# Patient Record
Sex: Male | Born: 1975 | Race: White | Hispanic: No | Marital: Married | Smoking: Never smoker
Health system: Southern US, Community
[De-identification: ages and names within clinical notes are randomized; demographics above are authoritative.]

## PROBLEM LIST (undated history)

## (undated) DIAGNOSIS — J45909 Unspecified asthma, uncomplicated: Secondary | ICD-10-CM

## (undated) HISTORY — DX: Unspecified asthma, uncomplicated: J45.909

---

## 2001-09-26 ENCOUNTER — Encounter: Payer: Self-pay | Admitting: Family Medicine

## 2001-09-26 ENCOUNTER — Ambulatory Visit (HOSPITAL_COMMUNITY): Admission: RE | Admit: 2001-09-26 | Discharge: 2001-09-26 | Payer: Self-pay | Admitting: Family Medicine

## 2008-01-30 HISTORY — PX: BACK SURGERY: SHX140

## 2008-12-17 ENCOUNTER — Emergency Department (HOSPITAL_COMMUNITY): Admission: EM | Admit: 2008-12-17 | Discharge: 2008-12-17 | Payer: Self-pay | Admitting: Emergency Medicine

## 2009-01-05 ENCOUNTER — Observation Stay (HOSPITAL_COMMUNITY): Admission: RE | Admit: 2009-01-05 | Discharge: 2009-01-06 | Payer: Self-pay | Admitting: Specialist

## 2009-01-05 ENCOUNTER — Encounter (INDEPENDENT_AMBULATORY_CARE_PROVIDER_SITE_OTHER): Payer: Self-pay | Admitting: Specialist

## 2010-05-02 LAB — CBC
MCHC: 33.7 g/dL (ref 30.0–36.0)
MCV: 90.4 fL (ref 78.0–100.0)
RDW: 12.7 % (ref 11.5–15.5)

## 2010-05-02 LAB — URINALYSIS, ROUTINE W REFLEX MICROSCOPIC
Bilirubin Urine: NEGATIVE
Hgb urine dipstick: NEGATIVE
Ketones, ur: NEGATIVE mg/dL
Nitrite: NEGATIVE
Urobilinogen, UA: 0.2 mg/dL (ref 0.0–1.0)

## 2010-05-02 LAB — COMPREHENSIVE METABOLIC PANEL
ALT: 48 U/L (ref 0–53)
Albumin: 4.5 g/dL (ref 3.5–5.2)
Alkaline Phosphatase: 56 U/L (ref 39–117)
BUN: 12 mg/dL (ref 6–23)
Calcium: 9.1 mg/dL (ref 8.4–10.5)
Potassium: 4.1 mEq/L (ref 3.5–5.1)
Sodium: 141 mEq/L (ref 135–145)
Total Protein: 7.2 g/dL (ref 6.0–8.3)

## 2010-05-02 LAB — APTT: aPTT: 30 seconds (ref 24–37)

## 2010-05-02 LAB — PROTIME-INR: Prothrombin Time: 13.9 seconds (ref 11.6–15.2)

## 2011-02-10 IMAGING — CR DG OR PORTABLE SPINE
1 series · 1 of 1 positions shown · non-contrast
Comparison: 01/05/2009 at 7711 hours

CLINICAL DATA: Disc protrusion at L4-5.

PORTABLE SPINE

[view not recorded]
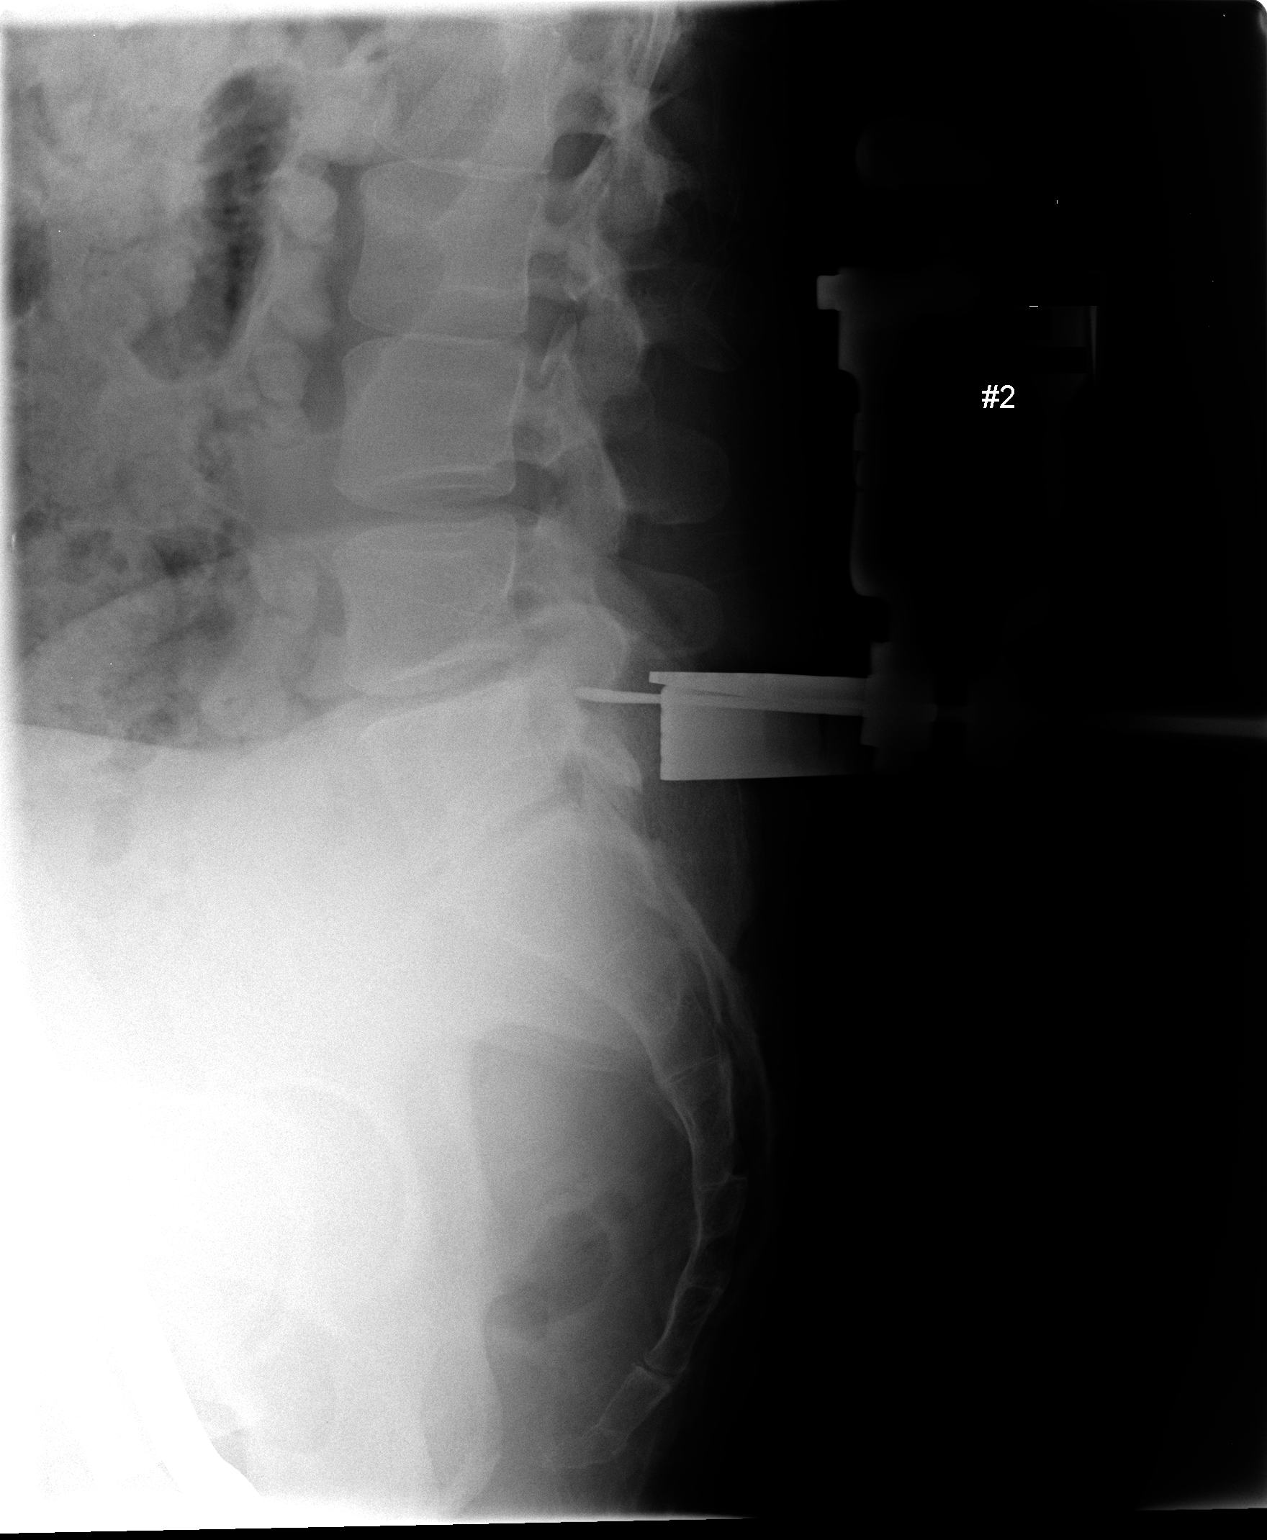

[1 of 1 positions shown; findings below may reference images not displayed]

FINDINGS: The blunt tip probe is oriented at the L5 S1 level, based
on the numeration used on the immediate prior exam.
IMPRESSION: 1.  Localization of the L5-S1 level.  The S1 vertebra is
transitional.

## 2018-10-31 ENCOUNTER — Other Ambulatory Visit: Payer: Self-pay | Admitting: *Deleted

## 2018-10-31 DIAGNOSIS — Z20822 Contact with and (suspected) exposure to covid-19: Secondary | ICD-10-CM

## 2018-11-02 LAB — NOVEL CORONAVIRUS, NAA: SARS-CoV-2, NAA: NOT DETECTED

## 2019-03-06 ENCOUNTER — Other Ambulatory Visit: Payer: Self-pay

## 2019-03-06 ENCOUNTER — Ambulatory Visit: Payer: Commercial Managed Care - PPO | Attending: Internal Medicine

## 2019-03-06 DIAGNOSIS — Z20822 Contact with and (suspected) exposure to covid-19: Secondary | ICD-10-CM | POA: Insufficient documentation

## 2019-03-08 LAB — NOVEL CORONAVIRUS, NAA: SARS-CoV-2, NAA: NOT DETECTED

## 2019-06-22 ENCOUNTER — Encounter: Payer: Self-pay | Admitting: Emergency Medicine

## 2019-06-22 ENCOUNTER — Other Ambulatory Visit: Payer: Self-pay

## 2019-06-22 ENCOUNTER — Ambulatory Visit
Admission: EM | Admit: 2019-06-22 | Discharge: 2019-06-22 | Disposition: A | Discharge status: Home / Self Care | Payer: Commercial Managed Care - PPO | Attending: Emergency Medicine | Admitting: Emergency Medicine

## 2019-06-22 DIAGNOSIS — L237 Allergic contact dermatitis due to plants, except food: Secondary | ICD-10-CM

## 2019-06-22 DIAGNOSIS — L299 Pruritus, unspecified: Secondary | ICD-10-CM

## 2019-06-22 DIAGNOSIS — R21 Rash and other nonspecific skin eruption: Secondary | ICD-10-CM

## 2019-06-22 MED ORDER — DEXAMETHASONE SODIUM PHOSPHATE 10 MG/ML IJ SOLN
10.0000 mg | Freq: Once | INTRAMUSCULAR | Status: AC
  Administered 2019-06-22: 10 mg via INTRAMUSCULAR

## 2019-06-22 MED ORDER — HYDROXYZINE HCL 25 MG PO TABS: 25.0000 mg | ORAL_TABLET | Freq: Four times a day (QID) | ORAL | 0 refills | Status: DC

## 2019-06-22 MED ORDER — PREDNISONE 20 MG PO TABS: 20.0000 mg | ORAL_TABLET | Freq: Two times a day (BID) | ORAL | 0 refills | Status: AC

## 2019-06-22 NOTE — Discharge Instructions (Signed)
Wash with warm water and mild soap Steroid shot given in office Prednisone prescribed.  Take as directed and to completion Use OTC zyrtec, allegra, or claritin during the day.  Benadryl or prescribed hydroxyzine at night. You may also use OTC hydrocortisone cream and/or calamine lotion to help alleviate itching Follow up with PCP if symptoms persists  Return or go to the ED if you have any new or worsening symptoms such as fever, chills, nausea, vomiting, difficulty breathing, throat swelling, tongue swelling, numbness/ tingling in mouth, worsening symptoms despite treatment, etc..Marland Kitchen

## 2019-06-22 NOTE — ED Triage Notes (Signed)
Poison oak rash that is itching and tingling to bilateral arms x2days.

## 2019-06-22 NOTE — ED Provider Notes (Signed)
[facility]   [id number] 06/22/19 Arrival Time: 9562  CC: Poison oak rash  SUBJECTIVE:  Ronald Gibson is a 44 y.o. male who presents with a poison oak rash to bilateral arms, ankles, and groin x 2 day.  Describes it as itchy.  Has tried OTC cream without relief.  Symptoms are made worse with scratching.  Reports similar symptoms in the past.   Denies fever, chills, nausea, vomiting, swelling, discharge, SOB, chest pain, abdominal pain, changes in bowel or bladder function.    ROS: As per HPI.  All other pertinent ROS negative.     History reviewed. No pertinent past medical history. Past Surgical History:  Procedure Laterality Date  . BACK SURGERY  2010   No Known Allergies No current facility-administered medications on file prior to encounter.   No current outpatient medications on file prior to encounter.   Social History   Socioeconomic History  . Marital status: Married    Spouse name: Not on file  . Number of children: Not on file  . Years of education: Not on file  . Highest education level: Not on file  Occupational History  . Not on file  Tobacco Use  . Smoking status: Not on file  Substance and Sexual Activity  . Alcohol use: Not on file  . Drug use: Not on file  . Sexual activity: Not on file  Other Topics Concern  . Not on file  Social History Narrative  . Not on file   Social Determinants of Health   Financial Resource Strain:   . Difficulty of Paying Living Expenses:   Food Insecurity:   . Worried About Charity fundraiser in the Last Year:   . Arboriculturist in the Last Year:   Transportation Needs:   . Film/video editor (Medical):   Marland Kitchen Lack of Transportation (Non-Medical):   Physical Activity:   . Days of Exercise per Week:   . Minutes of Exercise per Session:   Stress:   . Feeling of Stress :   Social Connections:   . Frequency of Communication with Friends and Family:   . Frequency of Social Gatherings with Friends and  Family:   . Attends Religious Services:   . Active Member of Clubs or Organizations:   . Attends Archivist Meetings:   Marland Kitchen Marital Status:   Intimate Partner Violence:   . Fear of Current or Ex-Partner:   . Emotionally Abused:   Marland Kitchen Physically Abused:   . Sexually Abused:    No family history on file.  OBJECTIVE: Vitals:   06/22/19 0955 06/22/19 0957  BP:  136/78  Pulse:  65  Resp:  17  Temp:  98.2 F (36.8 C)  TempSrc:  Oral  SpO2:  97%  Weight: 190 lb (86.2 kg)   Height: 6' (1.829 m)     General appearance: alert; no distress Head: NCAT Lungs: normal respiratory effor Extremities: no edema Skin: warm and dry; areas of linear papules and vesicles with surrounding erythema to bilateral upper extremities Psychological: alert and cooperative; normal mood and affect  ASSESSMENT & PLAN:  1. Poison oak dermatitis   2. Rash and nonspecific skin eruption   3. Itching     Meds ordered this encounter  Medications  . predniSONE (DELTASONE) 20 MG tablet    Sig: Take 1 tablet (20 mg total) by mouth 2 (two) times daily with a meal for 5 days.    Dispense:  10 tablet  Refill:  0    Order Specific Question:   Supervising Provider    Answer:   Eustace Moore [[id number]]  . hydrOXYzine (ATARAX/VISTARIL) 25 MG tablet    Sig: Take 1 tablet (25 mg total) by mouth every 6 (six) hours.    Dispense:  12 tablet    Refill:  0    Order Specific Question:   Supervising Provider    Answer:   Eustace Moore [[id number]]  . dexamethasone (DECADRON) injection 10 mg   Wash with warm water and mild soap Steroid shot given in office Prednisone prescribed.  Take as directed and to completion Use OTC zyrtec, allegra, or claritin during the day.  Benadryl or prescribed hydroxyzine at night. You may also use OTC hydrocortisone cream and/or calamine lotion to help alleviate itching Follow up with PCP if symptoms persists  Return or go to the ED if you have any new or worsening  symptoms such as fever, chills, nausea, vomiting, difficulty breathing, throat swelling, tongue swelling, numbness/ tingling in mouth, worsening symptoms despite treatment, etc...   Reviewed expectations re: course of current medical issues. Questions answered. Outlined signs and symptoms indicating need for more acute intervention. Patient verbalized understanding. After Visit Summary given.   Rennis Harding, PA-C 06/22/19 1009

## 2021-09-26 ENCOUNTER — Other Ambulatory Visit (HOSPITAL_COMMUNITY): Payer: Self-pay | Admitting: Family Medicine

## 2021-09-26 ENCOUNTER — Ambulatory Visit (HOSPITAL_COMMUNITY)
Admission: RE | Admit: 2021-09-26 | Discharge: 2021-09-26 | Disposition: A | Discharge status: Home / Self Care | Payer: Commercial Managed Care - PPO | Source: Ambulatory Visit | Attending: Family Medicine | Admitting: Family Medicine

## 2021-09-26 DIAGNOSIS — J45909 Unspecified asthma, uncomplicated: Secondary | ICD-10-CM | POA: Diagnosis present

## 2022-01-29 ENCOUNTER — Ambulatory Visit
Admission: EM | Admit: 2022-01-29 | Discharge: 2022-01-29 | Disposition: A | Discharge status: Home / Self Care | Payer: Commercial Managed Care - PPO | Attending: Nurse Practitioner | Admitting: Nurse Practitioner

## 2022-01-29 ENCOUNTER — Encounter: Payer: Self-pay | Admitting: Emergency Medicine

## 2022-01-29 DIAGNOSIS — R1310 Dysphagia, unspecified: Secondary | ICD-10-CM

## 2022-01-29 DIAGNOSIS — J029 Acute pharyngitis, unspecified: Secondary | ICD-10-CM | POA: Diagnosis not present

## 2022-01-29 MED ORDER — AMOXICILLIN 500 MG PO CAPS: 500.0000 mg | ORAL_CAPSULE | Freq: Two times a day (BID) | ORAL | 0 refills | Status: AC

## 2022-01-29 NOTE — ED Provider Notes (Signed)
[facility]    CSN: [id number] Arrival date & time: 01/29/22  1432      History   Chief Complaint Chief Complaint  Patient presents with   Sore Throat   Nasal Congestion    HPI Ronald Gibson is a 47 y.o. male.   Patient presents today for a few day history of sensation that something is lodged inside the right side of his throat.  He denies recent fever, shortness of breath or chest pain, sore throat, swollen glands, headache, ear pain or pressure, abdominal pain, nausea/vomiting, diarrhea, decreased appetite, or fatigue.  Reports he has been dealing with cough and congestion that has come and gone for the past year.  He is getting over cough and congestion that he has been with for the past week or so.  No postnasal drainage, runny nose or nasal congestion.  Reports he has a sensation that he feels like he needs to vomit, or nothing comes up.  The sensation that something is lodged inside of his throat gets better when he attempts to vomit.  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  No history of smoking or vaping.  Reports history of asthma, currently taking Singulair and Breztri Asthma inhaler daily.  Has not needed to use rescue inhaler per his report.     History reviewed. No pertinent past medical history.  There are no problems to display for this patient.   Past Surgical History:  Procedure Laterality Date   BACK SURGERY  2010       Home Medications    Prior to Admission medications   Medication Sig Start Date End Date Taking? Authorizing Provider  amoxicillin (AMOXIL) 500 MG capsule Take 1 capsule (500 mg total) by mouth 2 (two) times daily for 10 days. 01/29/22 02/08/22 Yes Eulogio Bear, NP  hydrOXYzine (ATARAX/VISTARIL) 25 MG tablet Take 1 tablet (25 mg total) by mouth every 6 (six) hours. 06/22/19   Lestine Box, PA-C    Family History History reviewed. No pertinent family history.  Social History      Allergies   Patient has no known allergies.   Review of Systems Review of Systems Per HPI  Physical Exam Triage Vital Signs ED Triage Vitals  Enc Vitals Group     BP 01/29/22 1631 (!) 153/95     Pulse Rate 01/29/22 1631 65     Resp 01/29/22 1631 17     Temp 01/29/22 1631 98 F (36.7 C)     Temp Source 01/29/22 1631 Oral     SpO2 01/29/22 1631 96 %     Weight --      Height --      Head Circumference --      Peak Flow --      Pain Score 01/29/22 1630 4     Pain Loc --      Pain Edu? --      Excl. in [place]? --    No data found.  Updated Vital Signs BP (!) 153/95 (BP Location: Right Arm)   Pulse 65   Temp 98 F (36.7 C) (Oral)   Resp 17   SpO2 96%   Visual Acuity Right Eye Distance:   Left Eye Distance:   Bilateral Distance:    Right Eye Near:   Left Eye Near:    Bilateral Near:     Physical Exam Vitals and nursing note reviewed.  Constitutional:      General: He  is not in acute distress.    Appearance: He is well-developed. He is not toxic-appearing.  HENT:     Head: Normocephalic and atraumatic.     Right Ear: Tympanic membrane and ear canal normal. No drainage, swelling or tenderness. No middle ear effusion. Tympanic membrane is not erythematous.     Left Ear: Tympanic membrane and ear canal normal. No drainage, swelling or tenderness.  No middle ear effusion. Tympanic membrane is not erythematous.     Nose: No congestion or rhinorrhea.     Mouth/Throat:     Mouth: Mucous membranes are moist.     Pharynx: Oropharynx is clear. No oropharyngeal exudate, posterior oropharyngeal erythema or uvula swelling.     Tonsils: No tonsillar exudate. 0 on the right. 0 on the left.     Comments: Cobblestoning of posterior pharynx Neck:     Thyroid: No thyromegaly.  Cardiovascular:     Rate and Rhythm: Normal rate and regular rhythm.  Pulmonary:     Effort: Pulmonary effort is normal. No respiratory distress.     Breath sounds: Normal breath sounds. No wheezing,  rhonchi or rales.  Abdominal:     General: Bowel sounds are normal. There is no distension.     Palpations: Abdomen is soft.     Tenderness: There is no abdominal tenderness. There is no rebound.  Musculoskeletal:     Cervical back: Normal range of motion and neck supple.  Lymphadenopathy:     Cervical: No cervical adenopathy.  Skin:    General: Skin is warm and dry.     Capillary Refill: Capillary refill takes less than 2 seconds.     Coloration: Skin is not pale.     Findings: No erythema or rash.  Neurological:     Mental Status: He is alert and oriented to person, place, and time.  Psychiatric:        Behavior: Behavior is cooperative.      UC Treatments / Results  Labs (all labs ordered are listed, but only abnormal results are displayed) Labs Reviewed - No data to display  EKG   Radiology No results found.  Procedures Procedures (including critical care time)  Medications Ordered in UC Medications - No data to display  Initial Impression / Assessment and Plan / UC Course  I have reviewed the triage vital signs and the nursing notes.  Pertinent labs & imaging results that were available during my care of the patient were reviewed by me and considered in my medical decision making (see chart for details).   Patient is well-appearing, normotensive, afebrile, not tachycardic, not tachypneic, oxygenating well on room air.    Swallowing problem Acute pharyngitis, unspecified etiology Differentials include bacterial pharyngitis, GERD Will cover for bacterial pharyngitis given on and off congestion for the past year with amoxicillin twice daily for 10 days Recommended over-the-counter omeprazole with improvement in symptoms Recommended close follow-up with primary care provider if no improvement with these medications Could also consider follow-up with ENT for further evaluation of esophagus Strict ER precautions discussed  The patient was given the opportunity to  ask questions.  All questions answered to their satisfaction.  The patient is in agreement to this plan.     Final Clinical Impressions(s) / UC Diagnoses   Final diagnoses:  Swallowing problem  History of asthma  Acute pharyngitis, unspecified etiology     Discharge Instructions      I am unsure what is causing the pressure in your throat today.  I think it could be a bacterial infection in your throat or possibly acid reflux.  Start the amoxicillin twice daily for 10 days to treat bacterial infection.  With no improvement, you can begin the omeprazole 20 mg OTC which would help with acid build up in your stomach.  This would take a couple of weeks to take effect.  Follow up with PCP with no improvement or worsening of symptoms despite these treatments for further evaluation.     ED Prescriptions     Medication Sig Dispense Auth. Provider   amoxicillin (AMOXIL) 500 MG capsule Take 1 capsule (500 mg total) by mouth 2 (two) times daily for 10 days. 20 capsule Valentino Nose, NP      PDMP not reviewed this encounter.   Valentino Nose, NP 01/29/22 1723

## 2022-01-29 NOTE — ED Triage Notes (Signed)
Pt reports feeling like something is lodged in his throat on the right side and it feels like its pushing up against it. Feels like vomiting will the relieve the pressure but nothing comes up.

## 2022-01-29 NOTE — Discharge Instructions (Addendum)
I am unsure what is causing the pressure in your throat today.  I think it could be a bacterial infection in your throat or possibly acid reflux.  Start the amoxicillin twice daily for 10 days to treat bacterial infection.  With no improvement, you can begin the omeprazole 20 mg OTC which would help with acid build up in your stomach.  This would take a couple of weeks to take effect.  Follow up with PCP with no improvement or worsening of symptoms despite these treatments for further evaluation.

## 2022-02-03 ENCOUNTER — Ambulatory Visit (INDEPENDENT_AMBULATORY_CARE_PROVIDER_SITE_OTHER): Payer: Commercial Managed Care - PPO

## 2022-02-03 ENCOUNTER — Ambulatory Visit
Admission: EM | Admit: 2022-02-03 | Discharge: 2022-02-03 | Disposition: A | Discharge status: Home / Self Care | Payer: Commercial Managed Care - PPO

## 2022-02-03 DIAGNOSIS — R0602 Shortness of breath: Secondary | ICD-10-CM

## 2022-02-03 DIAGNOSIS — J45901 Unspecified asthma with (acute) exacerbation: Secondary | ICD-10-CM

## 2022-02-03 DIAGNOSIS — R062 Wheezing: Secondary | ICD-10-CM | POA: Diagnosis not present

## 2022-02-03 MED ORDER — ALBUTEROL SULFATE (2.5 MG/3ML) 0.083% IN NEBU
2.5000 mg | INHALATION_SOLUTION | Freq: Once | RESPIRATORY_TRACT | Status: AC
  Administered 2022-02-03: 2.5 mg via RESPIRATORY_TRACT

## 2022-02-03 MED ORDER — PREDNISONE 50 MG PO TABS: ORAL_TABLET | ORAL | 0 refills | Status: DC

## 2022-02-03 MED ORDER — METHYLPREDNISOLONE SODIUM SUCC 125 MG IJ SOLR
80.0000 mg | Freq: Once | INTRAMUSCULAR | Status: AC
  Administered 2022-02-03: 80 mg via INTRAMUSCULAR

## 2022-02-03 NOTE — ED Provider Notes (Signed)
[facility]    CSN: [id number] Arrival date & time: 02/03/22  0804      History   Chief Complaint Chief Complaint  Patient presents with   Wheezing    HPI Ronald Gibson is a 47 y.o. male.   The history is provided by the patient.   Patient presents for complaints of wheezing and shortness of breath that has been occurring over the last several days.  Patient reports that over the last year, he has had intermittent nasal congestion.  Patient was seen in this clinic on 01/29/22 and prescribed amoxicillin.  He is currently still taking this medication.  He denies fever, chills, sore throat, abdominal pain, nausea, vomiting, diarrhea, or chest pain.  Patient reports that he does use Symbicort and Singulair regularly.  He also reports that he has an albuterol inhaler.  Patient reports a history of childhood asthma.  Reports that his asthma has been controlled since childhood up until the last year.  History reviewed. No pertinent past medical history.  There are no problems to display for this patient.   Past Surgical History:  Procedure Laterality Date   BACK SURGERY  2010       Home Medications    Prior to Admission medications   Medication Sig Start Date End Date Taking? Authorizing Provider  albuterol (VENTOLIN HFA) 108 (90 Base) MCG/ACT inhaler SMARTSIG:1 Puff(s) By Mouth Every 4 Hours PRN 11/05/21  Yes [provider]  montelukast (SINGULAIR) 10 MG tablet Take 10 mg by mouth daily. 01/21/22  Yes [provider]  predniSONE (DELTASONE) 50 MG tablet Take one tablet daily with breakfast for the next 5 days. 02/03/22  Yes Javanni Maring-Warren, Alda Lea, NP  SYMBICORT 160-4.5 MCG/ACT inhaler SMARTSIG:2 Puff(s) By Mouth Twice Daily 09/27/21  Yes [provider]  amoxicillin (AMOXIL) 500 MG capsule Take 1 capsule (500 mg total) by mouth 2 (two) times daily for 10 days. 01/29/22 02/08/22  Eulogio Bear, NP  hydrOXYzine (ATARAX/VISTARIL) 25 MG tablet  Take 1 tablet (25 mg total) by mouth every 6 (six) hours. 06/22/19   Lestine Box, PA-C    Family History History reviewed. No pertinent family history.  Social History Social History   Tobacco Use   Smoking status: Never   Smokeless tobacco: Never  Substance Use Topics   Alcohol use: Never   Drug use: Never     Allergies   Patient has no known allergies.   Review of Systems Review of Systems Per HPI  Physical Exam Triage Vital Signs ED Triage Vitals  Enc Vitals Group     BP 02/03/22 0816 (!) 148/95     Pulse Rate 02/03/22 0816 91     Resp 02/03/22 0816 18     Temp 02/03/22 0816 98.4 F (36.9 C)     Temp Source 02/03/22 0816 Oral     SpO2 02/03/22 0816 94 %     Weight --      Height --      Head Circumference --      Peak Flow --      Pain Score 02/03/22 0819 0     Pain Loc --      Pain Edu? --      Excl. in [place]? --    No data found.  Updated Vital Signs BP (!) 148/95 (BP Location: Right Arm)   Pulse 91   Temp 98.4 F (36.9 C) (Oral)   Resp 18   SpO2 97%   Visual  Acuity Right Eye Distance:   Left Eye Distance:   Bilateral Distance:    Right Eye Near:   Left Eye Near:    Bilateral Near:     Physical Exam Vitals and nursing note reviewed.  Constitutional:      General: He is not in acute distress.    Appearance: Normal appearance. He is well-developed.  HENT:     Head: Normocephalic and atraumatic.     Right Ear: Tympanic membrane, ear canal and external ear normal.     Left Ear: Tympanic membrane, ear canal and external ear normal.     Nose: Congestion present. No rhinorrhea.     Mouth/Throat:     Mouth: Mucous membranes are moist.  Eyes:     Extraocular Movements: Extraocular movements intact.     Conjunctiva/sclera: Conjunctivae normal.     Pupils: Pupils are equal, round, and reactive to light.  Neck:     Thyroid: No thyromegaly.     Trachea: No tracheal deviation.  Cardiovascular:     Rate and Rhythm: Normal rate and regular  rhythm.     Pulses: Normal pulses.     Heart sounds: Normal heart sounds.  Pulmonary:     Effort: Pulmonary effort is normal.     Breath sounds: Decreased air movement present. Examination of the right-upper field reveals wheezing. Examination of the left-upper field reveals wheezing. Examination of the right-lower field reveals wheezing. Examination of the left-lower field reveals wheezing. Wheezing present.     Comments: Patient is speaking in complete sentences, and is in no acute distress.  Nebulizer treatment administered.  Post nebulizer treatment, patient with improvement of air movement, wheezing noted, but markedly decreased throughout. Abdominal:     General: Bowel sounds are normal. There is no distension.     Palpations: Abdomen is soft.     Tenderness: There is no abdominal tenderness.  Musculoskeletal:     Cervical back: Normal range of motion and neck supple.  Skin:    General: Skin is warm and dry.  Neurological:     General: No focal deficit present.     Mental Status: He is alert and oriented to person, place, and time.  Psychiatric:        Mood and Affect: Mood normal.        Behavior: Behavior normal.        Thought Content: Thought content normal.        Judgment: Judgment normal.      UC Treatments / Results  Labs (all labs ordered are listed, but only abnormal results are displayed) Labs Reviewed - No data to display  EKG   Radiology DG Chest 2 View  Result Date: 02/03/2022 CLINICAL DATA:  Shortness of breath and wheezing. EXAM: CHEST - 2 VIEW COMPARISON:  September 26, 2021 FINDINGS: The heart size and mediastinal contours are within normal limits. Both lungs are clear. The visualized skeletal structures are unremarkable. IMPRESSION: No active cardiopulmonary disease. Electronically Signed   By: Gerome Sam III M.D.   On: 02/03/2022 08:38    Procedures Procedures (including critical care time)  Medications Ordered in UC Medications  albuterol  (PROVENTIL) (2.5 MG/3ML) 0.083% nebulizer solution 2.5 mg (2.5 mg Nebulization Given 02/03/22 0836)  methylPREDNISolone sodium succinate (SOLU-MEDROL) 125 mg/2 mL injection 80 mg (80 mg Intramuscular Given 02/03/22 0836)    Initial Impression / Assessment and Plan / UC Course  I have reviewed the triage vital signs and the nursing notes.  Pertinent labs &  imaging results that were available during my care of the patient were reviewed by me and considered in my medical decision making (see chart for details).  The patient is well-appearing, he is in no acute distress, vital signs are stable.  Symptoms consistent with an acute asthma exacerbation.  Chest x-ray did not show any active cardiopulmonary disease.  Solu-Medrol 80 mg IM and albuterol nebulizer was administered.  Patient reports good improvement of his breathing and wheezing.  Will start patient on prednisone for 50 mg for the next 5 days.  Patient is currently still taking his amoxicillin, continue medication at this time.  Supportive care recommendations were provided to the patient to include use of a humidifier at bedtime during sleep, and sleeping elevated as needed.  Strict ER precautions were provided to the patient.  Patient verbalizes understanding.  All questions were answered.  Patient stable for discharge.   Final Clinical Impressions(s) / UC Diagnoses   Final diagnoses:  Asthma with acute exacerbation, unspecified asthma severity, unspecified whether persistent     Discharge Instructions      The chest x-ray is negative for pneumonia or other abnormal findings. Take medication as prescribed.  Continue use of the Symbicort, albuterol inhaler, and montelukast that you are currently taking. Increase fluids and allow for plenty of rest. Try to become aware of things that may trigger your allergies. Recommend use of a humidifier in your bedroom at nighttime during sleep to help with cough.  May also be helpful to sleep elevated  on pillows as needed.   Go to the emergency department if you experience worsening shortness of breath, wheezing, or other concerns. Recommend following up with your primary care physician within the next 7 to 10 days for reevaluation. Follow-up as needed.      ED Prescriptions     Medication Sig Dispense Auth. Provider   predniSONE (DELTASONE) 50 MG tablet Take one tablet daily with breakfast for the next 5 days. 5 tablet Liat Mayol-Warren, Sadie Haber, NP      PDMP not reviewed this encounter.   Abran Cantor, NP 02/03/22 [phone]

## 2022-02-03 NOTE — ED Triage Notes (Signed)
Pt reports on and off nasal congestion, chest congestion and wheezing. Reports he has asthma and this year was bad.

## 2022-02-03 NOTE — Discharge Instructions (Addendum)
The chest x-ray is negative for pneumonia or other abnormal findings. Take medication as prescribed.  Continue use of the Symbicort, albuterol inhaler, and montelukast that you are currently taking. Increase fluids and allow for plenty of rest. Try to become aware of things that may trigger your allergies. Recommend use of a humidifier in your bedroom at nighttime during sleep to help with cough.  May also be helpful to sleep elevated on pillows as needed.   Go to the emergency department if you experience worsening shortness of breath, wheezing, or other concerns. Recommend following up with your primary care physician within the next 7 to 10 days for reevaluation. Follow-up as needed.

## 2022-02-26 ENCOUNTER — Ambulatory Visit (INDEPENDENT_AMBULATORY_CARE_PROVIDER_SITE_OTHER): Payer: Commercial Managed Care - PPO | Admitting: Internal Medicine

## 2022-02-26 ENCOUNTER — Encounter: Payer: Self-pay | Admitting: Internal Medicine

## 2022-02-26 ENCOUNTER — Other Ambulatory Visit: Payer: Self-pay

## 2022-02-26 DIAGNOSIS — R053 Chronic cough: Secondary | ICD-10-CM | POA: Diagnosis not present

## 2022-02-26 DIAGNOSIS — R0602 Shortness of breath: Secondary | ICD-10-CM

## 2022-02-26 DIAGNOSIS — R062 Wheezing: Secondary | ICD-10-CM

## 2022-02-26 DIAGNOSIS — J310 Chronic rhinitis: Secondary | ICD-10-CM | POA: Diagnosis not present

## 2022-02-26 DIAGNOSIS — J4540 Moderate persistent asthma, uncomplicated: Secondary | ICD-10-CM | POA: Diagnosis not present

## 2022-02-26 DIAGNOSIS — K219 Gastro-esophageal reflux disease without esophagitis: Secondary | ICD-10-CM

## 2022-02-26 DIAGNOSIS — R09A2 Foreign body sensation, throat: Secondary | ICD-10-CM

## 2022-02-26 MED ORDER — FLUTICASONE PROPIONATE 50 MCG/ACT NA SUSP: 2.0000 | Freq: Every day | NASAL | 5 refills | Status: DC

## 2022-02-26 MED ORDER — BREZTRI AEROSPHERE 160-9-4.8 MCG/ACT IN AERO: 2.0000 | INHALATION_SPRAY | Freq: Two times a day (BID) | RESPIRATORY_TRACT | 5 refills | Status: DC

## 2022-02-26 MED ORDER — FAMOTIDINE 20 MG PO TABS: 20.0000 mg | ORAL_TABLET | Freq: Every day | ORAL | 5 refills | Status: DC | PRN

## 2022-02-26 MED ORDER — ALBUTEROL SULFATE HFA 108 (90 BASE) MCG/ACT IN AERS: 2.0000 | INHALATION_SPRAY | Freq: Four times a day (QID) | RESPIRATORY_TRACT | 1 refills | Status: DC | PRN

## 2022-02-26 MED ORDER — MONTELUKAST SODIUM 10 MG PO TABS: 10.0000 mg | ORAL_TABLET | Freq: Every day | ORAL | 5 refills | Status: DC

## 2022-02-26 NOTE — Addendum Note (Signed)
Addended by: Jimmy Footman on: 02/26/2022 02:06 PM   Modules accepted: Orders

## 2022-02-26 NOTE — Patient Instructions (Addendum)
Chronic Rhinitis: - Use nasal saline rinses before nose sprays such as with Neilmed Sinus Rinse.  Use distilled water.   - Use Flonase 2 sprays each nostril daily. Aim upward and outward. - Hold all anti-histamines 3 days prior to next visit for aeroallergen testing 1-59.    Moderate Persistent Asthma: - MDI technique discussed.  Spirometry normal today. Spacer given.  - Maintenance inhaler:  Continue Breztri 2 puffs twice daily with spacer. Brush and gargle after.  Continue Singulair 10mg  daily.  - Rescue inhaler: Albuterol 2 puffs via spacer or 1 vial via nebulizer every 4-6 hours as needed for respiratory symptoms of cough, shortness of breath, or wheezing Asthma control goals:  Full participation in all desired activities (may need albuterol before activity) Albuterol use two times or less a week on average (not counting use with activity) Cough interfering with sleep two times or less a month Oral steroids no more than once a year No hospitalizations  GERD/Globus Sensation -Can consider ENT referral if persistent for globus sensation.  -Use Pepcid 20mg  daily as needed.   -Avoid lying down for at least two hours after a meal or after drinking acidic beverages, like soda, or other caffeinated beverages. This can help to prevent stomach contents from flowing back into the esophagus. -Keep your head elevated while you sleep. Using an extra pillow or two can also help to prevent reflux. -Eat smaller and more frequent meals each day instead of a few large meals. This promotes digestion and can aid in preventing heartburn. -Wear loose-fitting clothes to ease pressure on the stomach, which can worsen heartburn and reflux. -Reduce excess weight around the midsection. This can ease pressure on the stomach. Such pressure can force some stomach contents back up the esophagus.

## 2022-02-26 NOTE — Progress Notes (Signed)
NEW PATIENT  Date of Service/Encounter:  02/26/22  Consult requested by: [facility]   Subjective:   Ronald Gibson (DOB: 1975-05-17) is a 47 y.o. male who presents to the clinic on 02/26/2022 with a chief complaint of Asthma (Asthma was pretty bad when he was younger. His allergies have flared up his asthma. ) and Allergic Rhinitis  (Had allergy shots when he was young. Allergies have been going on worse for the past year and has flared his asthma up since then. ) .    History obtained from: chart review and patient.   Asthma:  Diagnosed at age 33 He does have trouble with asthma flare ups related to allergy flare ups.  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 His asthma has worsened for the past 1.5 year and so has his allergies.  He did have COVID about 2 years ago and reports it was mild.  He had a flare up recently at the beginning of this month requiring albuterol nebulizer and prednisone.  Only with flare ups daytime symptoms in past month, no nighttime awakenings in past month Using rescue inhaler: last use was 2 weeks ago with flare up Limitations to daily activity: mild 1 ED visits,/UC visits and 4 oral steroids in the past year 0 number of lifetime hospitalizations, 0 number of lifetime intubations.  Identified Triggers:  allergies and respiratory illness Prior PFTs or spirometry: none Previously on Symbicort   Current regimen:  Maintenance: Singulair daily and Breztri 2 puffs twice daily; he does gargle afterwards but does not use a spacer Rescue: Albuterol 2 puffs q4-6 hrs PRN  Rhinitis:  Started since infancy.  Symptoms include: nasal congestion, rhinorrhea, post nasal drainage, and sneezing  He did AIT when he was young for a couple of years but can't recall why he stopped.  Occurs year-round Potential triggers: not  sure Treatments tried:  Flonase daily  Previous allergy testing: yes when he was a child but can't recall the results History of reflux/heartburn: not sure; he feels like something is lodged in his throat and he has tried Tums without relief. Denies any heartburn.   History of sinus surgery: no Nonallergic triggers: none   Past Medical History: Past Medical History:  Diagnosis Date   Asthma      Past Surgical History: Past Surgical History:  Procedure Laterality Date   BACK SURGERY  2010    Family History: Family History  Problem Relation Age of Onset   Emphysema Father    Asthma Brother     Social History:  Tobacco use/exposure: none Job: Quarry manager  Medication List:  Allergies as of 02/26/2022   No Known Allergies      Medication List        Accurate as of February 26, 2022 12:24 PM. If you have any questions, ask your nurse or doctor.          AirDuo Digihaler 55-14 MCG/ACT Aepb Generic drug: Fluticasone-Salmeterol(sensor) SMARTSIG:1 Puff(s) By Mouth Every 12 Hours   albuterol 108 (90 Base) MCG/ACT inhaler Commonly known as: VENTOLIN HFA SMARTSIG:1 Puff(s) By Mouth Every 4 Hours PRN   Breztri Aerosphere 160-9-4.8 MCG/ACT Aero Generic drug: Budeson-Glycopyrrol-Formoterol Inhale 2 puffs into the lungs 2 (two) times daily.   FLONASE NA Place into the nose.   hydrOXYzine 25 MG tablet Commonly known as: ATARAX Take 1 tablet (25 mg total)  by mouth every 6 (six) hours.   montelukast 10 MG tablet Commonly known as: SINGULAIR Take 10 mg by mouth daily.   predniSONE 50 MG tablet Commonly known as: DELTASONE Take one tablet daily with breakfast for the next 5 days.   Symbicort 160-4.5 MCG/ACT inhaler Generic drug: budesonide-formoterol SMARTSIG:2 Puff(s) By Mouth Twice Daily         REVIEW OF SYSTEMS: Pertinent positives and negatives discussed in HPI.   Objective:   Physical Exam: BP 130/86   Pulse (!) 105   Temp 98 F (36.7 C)    Resp 18   Ht 5\' 10"  (1.778 m)   Wt 203 lb 2 oz (92.1 kg)   SpO2 97%   BMI 29.15 kg/m  Body mass index is 29.15 kg/m. GEN: alert, well developed HEENT: clear conjunctiva, TM grey and translucent, nose with + inferior turbinate hypertrophy, pink nasal mucosa, slight clear rhinorrhea, + cobblestoning HEART: regular rate and rhythm, no murmur LUNGS: clear to auscultation bilaterally, no coughing, unlabored respiration ABDOMEN: soft, non distended  SKIN: no rashes or lesions  Reviewed:  02/03/2022: seen in ER for wheezing, SOB for the past several days.  Also has congestion and was given amoxicillin.  On symbicort and singulair daily. Wheezing and decreased air movement noted on exam. Given Solumedrol and albuterol with improvement. Discharged home on prednisone.   01/29/2022: seen in ER for sensation of something lodged in R side of throat.  Also with cough/congestion. On singulair and Breztri.  Treated for possible pharyngitis with Amoxicillin.  Also discussed GERD.    01/14/2017: seen in [facility] for acute pansinusitis, started on Augmentin, Flonase, nasal saline, Guaifenesin-DM.   Spirometry:  Tracings reviewed. His effort: Good reproducible efforts. FVC: 4.45L FEV1: 3.74L, 91% predicted FEV1/FVC ratio: 84% Interpretation: Spirometry consistent with normal pattern.  Please see scanned spirometry results for details.   Assessment:   1. Moderate persistent asthma without complication   2. Chronic rhinitis   3. Gastroesophageal reflux disease, unspecified whether esophagitis present   4. Globus sensation     Plan/Recommendations:  Chronic Rhinitis: - Use nasal saline rinses before nose sprays such as with Neilmed Sinus Rinse.  Use distilled water.   - Use Flonase 2 sprays each nostril daily. Aim upward and outward. - Hold all anti-histamines 3 days prior to next visit for aeroallergen testing 1-59.    Moderate Persistent Asthma: - Uncontrolled with recent exacerbation  and seems he was escalated from Symbicort to [place].  Will see how he does on triple inhaler.  If he remains uncontrolled, next step would be a biologic.  - MDI technique discussed.  Spirometry normal today on Breztri. Spacer given.  - Maintenance inhaler:  Continue Breztri 158mcg 2 puffs twice daily with spacer. Brush and gargle after.  Continue Singulair 10mg  daily.  - Rescue inhaler: Albuterol 2 puffs via spacer or 1 vial via nebulizer every 4-6 hours as needed for respiratory symptoms of cough, shortness of breath, or wheezing Asthma control goals:  Full participation in all desired activities (may need albuterol before activity) Albuterol use two times or less a week on average (not counting use with activity) Cough interfering with sleep two times or less a month Oral steroids no more than once a year No hospitalizations  GERD/Globus Sensation -Can consider ENT referral if persistent for globus sensation.  -Use Pepcid 20mg  daily as needed.   -Avoid lying down for at least two hours after a meal or after drinking acidic beverages, like soda,  or other caffeinated beverages. This can help to prevent stomach contents from flowing back into the esophagus. -Keep your head elevated while you sleep. Using an extra pillow or two can also help to prevent reflux. -Eat smaller and more frequent meals each day instead of a few large meals. This promotes digestion and can aid in preventing heartburn. -Wear loose-fitting clothes to ease pressure on the stomach, which can worsen heartburn and reflux. -Reduce excess weight around the midsection. This can ease pressure on the stomach. Such pressure can force some stomach contents back up the esophagus.      Return in about 1 week (around 03/05/2022).  Alesia Morin, MD [facility]

## 2022-03-05 ENCOUNTER — Ambulatory Visit (INDEPENDENT_AMBULATORY_CARE_PROVIDER_SITE_OTHER): Payer: Commercial Managed Care - PPO | Admitting: Internal Medicine

## 2022-03-05 ENCOUNTER — Encounter: Payer: Self-pay | Admitting: Internal Medicine

## 2022-03-05 VITALS — BP 132/84 | HR 82 | Temp 99.1°F | Resp 20

## 2022-03-05 DIAGNOSIS — J3089 Other allergic rhinitis: Secondary | ICD-10-CM

## 2022-03-05 DIAGNOSIS — J302 Other seasonal allergic rhinitis: Secondary | ICD-10-CM

## 2022-03-05 MED ORDER — CETIRIZINE HCL 10 MG PO TABS: 10.0000 mg | ORAL_TABLET | Freq: Every day | ORAL | 5 refills | Status: DC

## 2022-03-05 MED ORDER — AZELASTINE HCL 0.1 % NA SOLN: 2.0000 | Freq: Two times a day (BID) | NASAL | 5 refills | Status: DC | PRN

## 2022-03-05 MED ORDER — OLOPATADINE HCL 0.1 % OP SOLN: 1.0000 [drp] | OPHTHALMIC | 5 refills | Status: DC | PRN

## 2022-03-05 NOTE — Progress Notes (Signed)
FOLLOW UP Date of Service/Encounter:  03/05/22   Subjective:  Ronald Gibson (DOB: 12/01/1975) is a 47 y.o. male who returns to the [facility] on 03/05/2022 for follow up for skin testing.   History obtained from: chart review and patient. Reports asthma is doing well. No albuterol use last week and only once this morning due to mild SOB.  No wheezing.  Still having a lot of congestion, runny nose, drainage.    Past Medical History: Past Medical History:  Diagnosis Date   Asthma     Objective:  BP 132/84   Pulse 82   Temp 99.1 F (37.3 C)   Resp 20   SpO2 97%  There is no height or weight on file to calculate BMI. Physical Exam: GEN: alert, well developed HEENT: clear conjunctiva, MMM HEART: regular rate LUNGS: no coughing, unlabored respiration SKIN: no rashes or lesions  Skin Testing:  Skin prick testing was placed, which includes aeroallergens/foods, histamine control, and saline control.  Verbal consent was obtained prior to placing test.  Patient tolerated procedure well.  Allergy testing results were read and interpreted by myself, documented by clinical staff. Adequate positive and negative control.  Positive results to:  Results discussed with patient/family.  Airborne Adult Perc - 03/05/22 1016     Time Antigen Placed 1016    Allergen Manufacturer Lavella Hammock    Location Back    Number of Test 59    1. Control-Buffer 50% Glycerol Negative    2. Control-Histamine 1 mg/ml 3+    3. Albumin saline Negative    4. Blair 3+    5. [place] 3+    6. Johnson 3+    7. [place] Blue 3+    8. Meadow Fescue 3+    9. Perennial Rye 3+    10. Sweet Vernal 3+    11. Timothy 3+    12. Cocklebur Negative    13. Burweed Marshelder Negative    14. Ragweed, short Negative    15. Ragweed, Giant Negative    16. Plantain,  English Negative    17. Lamb's Quarters 2+    18. Sheep Sorrell 2+    19. Rough Pigweed 2+    20. Marsh Elder, Rough Negative    21.  Mugwort, Common 2+    22. Ash mix Negative    23. Birch mix Negative    24. Beech American 2+    25. Box, Elder Negative    26. Cedar, red Negative    27. Cottonwood, [place] Negative    28. Elm mix Negative    29. Hickory 3+    30. Maple mix 3+    31. Oak, [place] mix Negative    32. Pecan Pollen 3+    33. Pine mix Negative    34. Sycamore Eastern Negative    35. [place], Black Pollen 3+    36. Alternaria alternata Negative    37. Cladosporium Herbarum Negative    38. Aspergillus mix Negative    39. Penicillium mix Negative    40. Bipolaris sorokiniana (Helminthosporium) Negative    41. Drechslera spicifera (Curvularia) Negative    42. Mucor plumbeus 2+    43. Fusarium moniliforme 2+    44. Aureobasidium pullulans (pullulara) Negative    45. Rhizopus oryzae Negative    46. Botrytis cinera 2+    47. Epicoccum nigrum 2+    48. Phoma betae Negative    49. Candida Albicans Negative    50. Trichophyton  mentagrophytes Negative    51. Mite, D Farinae  5,000 AU/ml 3+    52. Mite, D Pteronyssinus  5,000 AU/ml 3+    53. Cat Hair 10,000 BAU/ml 2+    54.  Dog Epithelia 2+    55. Mixed Feathers Negative    56. Horse Epithelia 2+    57. Cockroach, German 2+    58. Mouse Negative    59. Tobacco Leaf Negative              Assessment:   1. Seasonal and perennial allergic rhinitis     Plan/Recommendations:  Allergic Rhinitis: - Positive skin test 03/2022: trees, grasses, weeds, mold, DM, cat, dog, horse, cockroach.  - Avoidance measures discussed. - Use nasal saline rinses before nose sprays such as with Neilmed Sinus Rinse.  Use distilled water.   - Use Flonase 2 sprays each nostril daily. Aim upward and outward. - Use Azelastine 2 sprays each nostril twice daily as needed. Aim upward and outward. - Use Zyrtec 10 mg daily.  - Use Singulair 10mg  daily.  Stop if there are any mood/behavioral changes. - For eyes, use Olopatadine or Ketotifen 1 eye drop daily as needed for  itchy, watery eyes.  Available over the counter, if not covered by insurance.  - Consider allergy shots as long term control of your symptoms by teaching your immune system to be more tolerant of your allergy triggers. Given information for insurance.   Moderate Persistent Asthma: - MDI technique discussed.  - Maintenance inhaler:  Continue Breztri 2 puffs twice daily with spacer. Brush and gargle after.  Continue Singulair 10mg  daily.  - Rescue inhaler: Albuterol 2 puffs via spacer or 1 vial via nebulizer every 4-6 hours as needed for respiratory symptoms of cough, shortness of breath, or wheezing Asthma control goals:  Full participation in all desired activities (may need albuterol before activity) Albuterol use two times or less a week on average (not counting use with activity) Cough interfering with sleep two times or less a month Oral steroids no more than once a year No hospitalizations  GERD/Globus Sensation -Can consider ENT referral if persistent for globus sensation.  -Use Pepcid 20mg  daily as needed.   -Avoid lying down for at least two hours after a meal or after drinking acidic beverages, like soda, or other caffeinated beverages. This can help to prevent stomach contents from flowing back into the esophagus. -Keep your head elevated while you sleep. Using an extra pillow or two can also help to prevent reflux. -Eat smaller and more frequent meals each day instead of a few large meals. This promotes digestion and can aid in preventing heartburn. -Wear loose-fitting clothes to ease pressure on the stomach, which can worsen heartburn and reflux. -Reduce excess weight around the midsection. This can ease pressure on the stomach. Such pressure can force some stomach contents back up the esophagus.    Return in about 4 weeks (around 04/02/2022).  Harlon Flor, MD [facility]

## 2022-03-05 NOTE — Patient Instructions (Addendum)
Allergic Rhinitis: - Positive skin test 03/2022: trees, grasses, weeds, mold, DM, cat, dog, horse, cockroach.  - Avoidance measures discussed. - Use nasal saline rinses before nose sprays such as with Neilmed Sinus Rinse.  Use distilled water.   - Use Flonase 2 sprays each nostril daily. Aim upward and outward. - Use Azelastine 2 sprays each nostril twice daily as needed. Aim upward and outward. - Use Zyrtec 10 mg daily.  - Use Singulair 10mg  daily.  Stop if there are any mood/behavioral changes. - For eyes, use Olopatadine or Ketotifen 1 eye drop daily as needed for itchy, watery eyes.  Available over the counter, if not covered by insurance.  - Consider allergy shots as long term control of your symptoms by teaching your immune system to be more tolerant of your allergy triggers  Moderate Persistent Asthma: - MDI technique discussed.  - Maintenance inhaler:  Continue Breztri 2 puffs twice daily with spacer. Brush and gargle after.  Continue Singulair 10mg  daily.  - Rescue inhaler: Albuterol 2 puffs via spacer or 1 vial via nebulizer every 4-6 hours as needed for respiratory symptoms of cough, shortness of breath, or wheezing Asthma control goals:  Full participation in all desired activities (may need albuterol before activity) Albuterol use two times or less a week on average (not counting use with activity) Cough interfering with sleep two times or less a month Oral steroids no more than once a year No hospitalizations  GERD/Globus Sensation -Can consider ENT referral if persistent for globus sensation.  -Use Pepcid 20mg  daily as needed.   -Avoid lying down for at least two hours after a meal or after drinking acidic beverages, like soda, or other caffeinated beverages. This can help to prevent stomach contents from flowing back into the esophagus. -Keep your head elevated while you sleep. Using an extra pillow or two can also help to prevent reflux. -Eat smaller and more frequent  meals each day instead of a few large meals. This promotes digestion and can aid in preventing heartburn. -Wear loose-fitting clothes to ease pressure on the stomach, which can worsen heartburn and reflux. -Reduce excess weight around the midsection. This can ease pressure on the stomach. Such pressure can force some stomach contents back up the esophagus.    ALLERGEN AVOIDANCE MEASURES   Dust Mites Use central air conditioning and heat; and change the filter monthly.  Pleated filters work better than mesh filters.  Electrostatic filters may also be used; wash the filter monthly.  Window air conditioners may be used, but do not clean the air as well as a central air conditioner.  Change or wash the filter monthly. Keep windows closed.  Do not use attic fans.   Encase the mattress, box springs and pillows with zippered, dust proof covers. Wash the bed linens in hot water weekly.   Remove carpet, especially from the bedroom. Remove stuffed animals, throw pillows, dust ruffles, heavy drapes and other items that collect dust from the bedroom. Do not use a humidifier.   Use wood, vinyl or leather furniture instead of cloth furniture in the bedroom. Keep the indoor humidity at 30 - 40%.  Monitor with a humidity gauge.  Molds - Indoor avoidance Use air conditioning to reduce indoor humidity.  Do not use a humidifier. Keep indoor humidity at 30 - 40%.  Use a dehumidifier if needed. In the bathroom use an exhaust fan or open a window after showering.  Wipe down damp surfaces after showering.  Clean bathrooms with  a mold-killing solution (diluted bleach, or products like Tilex, etc) at least once a month. In the kitchen use an exhaust fan to remove steam from cooking.  Throw away spoiled foods immediately, and empty garbage daily.  Empty water pans below self-defrosting refrigerators frequently. Vent the clothes dryer to the outside. Limit indoor houseplants; mold grows in the dirt.  No houseplants  in the bedroom. Remove carpet from the bedroom. Encase the mattress and box springs with a zippered encasing.  Molds - Outdoor avoidance Avoid being outside when the grass is being mowed, or the ground is tilled. Avoid playing in leaves, pine straw, hay, etc.  Dead plant materials contain mold. Avoid going into barns or grain storage areas. Remove leaves, clippings and compost from around the home.  Cockroach Limit spread of food around the house; especially keep food out of bedrooms. Keep food and garbage in closed containers with a tight lid.  Never leave food out in the kitchen.  Do not leave out pet food or dirty food bowls. Mop the kitchen floor and wash countertops at least once a week. Repair leaky pipes and faucets so there is no standing water to attract roaches. Plug up cracks in the house through which cockroaches can enter. Use bait stations and approved pesticides to reduce cockroach infestation. Pollen Avoidance Pollen levels are highest during the mid-day and afternoon.  Consider this when planning outdoor activities. Avoid being outside when the grass is being mowed, or wear a mask if the pollen-allergic person must be the one to mow the grass. Keep the windows closed to keep pollen outside of the home. Use an air conditioner to filter the air. Take a shower, wash hair, and change clothing after working or playing outdoors during pollen season. Pet Dander Keep the pet out of your bedroom and restrict it to only a few rooms. Be advised that keeping the pet in only one room will not limit the allergens to that room. Don't pet, hug or kiss the pet; if you do, wash your hands with soap and water. High-efficiency particulate air (HEPA) cleaners run continuously in a bedroom or living room can reduce allergen levels over time. Regular use of a high-efficiency vacuum cleaner or a central vacuum can reduce allergen levels. Giving your pet a bath at least once a week can reduce  airborne allergen.

## 2022-04-09 ENCOUNTER — Ambulatory Visit (INDEPENDENT_AMBULATORY_CARE_PROVIDER_SITE_OTHER): Payer: Commercial Managed Care - PPO | Admitting: Internal Medicine

## 2022-04-09 ENCOUNTER — Other Ambulatory Visit: Payer: Self-pay

## 2022-04-09 ENCOUNTER — Encounter: Payer: Self-pay | Admitting: Internal Medicine

## 2022-04-09 VITALS — BP 132/70 | HR 80 | Temp 98.0°F | Resp 20 | Ht 71.0 in | Wt 212.0 lb

## 2022-04-09 DIAGNOSIS — R09A2 Foreign body sensation, throat: Secondary | ICD-10-CM | POA: Diagnosis not present

## 2022-04-09 DIAGNOSIS — J302 Other seasonal allergic rhinitis: Secondary | ICD-10-CM

## 2022-04-09 DIAGNOSIS — K219 Gastro-esophageal reflux disease without esophagitis: Secondary | ICD-10-CM | POA: Diagnosis not present

## 2022-04-09 DIAGNOSIS — J3089 Other allergic rhinitis: Secondary | ICD-10-CM | POA: Diagnosis not present

## 2022-04-09 DIAGNOSIS — J4540 Moderate persistent asthma, uncomplicated: Secondary | ICD-10-CM

## 2022-04-09 MED ORDER — OLOPATADINE HCL 0.1 % OP SOLN: 1.0000 [drp] | OPHTHALMIC | 5 refills | Status: DC | PRN

## 2022-04-09 MED ORDER — CETIRIZINE HCL 10 MG PO TABS: 10.0000 mg | ORAL_TABLET | Freq: Every day | ORAL | 5 refills | Status: DC

## 2022-04-09 MED ORDER — FLUTICASONE PROPIONATE 50 MCG/ACT NA SUSP: 2.0000 | Freq: Every day | NASAL | 5 refills | Status: DC

## 2022-04-09 MED ORDER — AZELASTINE HCL 0.1 % NA SOLN: 2.0000 | Freq: Two times a day (BID) | NASAL | 5 refills | Status: DC | PRN

## 2022-04-09 MED ORDER — MONTELUKAST SODIUM 10 MG PO TABS: 10.0000 mg | ORAL_TABLET | Freq: Every day | ORAL | 5 refills | Status: DC

## 2022-04-09 MED ORDER — EPINEPHRINE 0.3 MG/0.3ML IJ SOAJ: 0.3000 mg | INTRAMUSCULAR | 1 refills | Status: DC | PRN

## 2022-04-09 MED ORDER — FAMOTIDINE 20 MG PO TABS: 20.0000 mg | ORAL_TABLET | Freq: Every day | ORAL | 5 refills | Status: DC | PRN

## 2022-04-09 MED ORDER — ALBUTEROL SULFATE HFA 108 (90 BASE) MCG/ACT IN AERS: 2.0000 | INHALATION_SPRAY | Freq: Four times a day (QID) | RESPIRATORY_TRACT | 1 refills | Status: DC | PRN

## 2022-04-09 MED ORDER — BREZTRI AEROSPHERE 160-9-4.8 MCG/ACT IN AERO: 2.0000 | INHALATION_SPRAY | Freq: Two times a day (BID) | RESPIRATORY_TRACT | 5 refills | Status: DC

## 2022-04-09 NOTE — Patient Instructions (Addendum)
Allergic Rhinitis: - Positive skin test 03/2022: trees, grasses, weeds, mold, DM, cat, dog, horse, cockroach.  - Avoidance measures discussed. - Use nasal saline rinses before nose sprays such as with Neilmed Sinus Rinse.  Use distilled water.   - Use Flonase 2 sprays each nostril daily. Aim upward and outward. - Use Azelastine 2 sprays each nostril twice daily as needed. Aim upward and outward. - Use Zyrtec 10 mg daily.  - Use Singulair '10mg'$  daily.  Stop if there are any mood/behavioral changes. - For eyes, use Olopatadine or Ketotifen 1 eye drop daily as needed for itchy, watery eyes.  Available over the counter, if not covered by insurance.  - We will place order for the allergy shots today.  Bring Epipen with you for each shot visit.  Do your best to stay on the shot schedule of once a week.    Moderate Persistent Asthma: - MDI technique discussed.  - Maintenance inhaler:  Continue Breztri 2 puffs twice daily with spacer. Brush and gargle after.  Continue Singulair '10mg'$  daily.  - Rescue inhaler: Albuterol 2 puffs via spacer or 1 vial via nebulizer every 4-6 hours as needed for respiratory symptoms of cough, shortness of breath, or wheezing Asthma control goals:  Full participation in all desired activities (may need albuterol before activity) Albuterol use two times or less a week on average (not counting use with activity) Cough interfering with sleep two times or less a month Oral steroids no more than once a year No hospitalizations  GERD/Globus Sensation -Can consider ENT referral if persistent for globus sensation.  -Use Pepcid '20mg'$  daily as needed or if symptoms worsen, do a 4 week course of Prilosec '20mg'$  daily.  -Avoid lying down for at least two hours after a meal or after drinking acidic beverages, like soda, or other caffeinated beverages. This can help to prevent stomach contents from flowing back into the esophagus. -Keep your head elevated while you sleep. Using an extra  pillow or two can also help to prevent reflux. -Eat smaller and more frequent meals each day instead of a few large meals. This promotes digestion and can aid in preventing heartburn. -Wear loose-fitting clothes to ease pressure on the stomach, which can worsen heartburn and reflux. -Reduce excess weight around the midsection. This can ease pressure on the stomach. Such pressure can force some stomach contents back up the esophagus.

## 2022-04-09 NOTE — Progress Notes (Signed)
FOLLOW UP Date of Service/Encounter:  04/09/22   Subjective:  Ronald Gibson (DOB: December 11, 1975) is a 47 y.o. male who returns to the [facility] on 04/09/2022 for follow up for allergic rhinoconjunctivitis, moderate persistent asthma, GERD/globus sensation.   History obtained from: chart review and patient. Last visit was with me on March 05, 2022 for skin testing.  He has severe allergic rhinitis and was positive to trees, grasses, weeds, mold, dust mite, cat, dog, horse, cockroach.  We started him on a combination of medical therapy with INCS, INAH, OAH and Singulair.  Rhinoconjunctivitis: Since last visit, he reports doing a little better and has noticed some difference in his stuffy nose.  But still continues to have frequent episodes of runny nose, congestion, drainage.  Still blows out a lot of mucus also.  Using Flonase daily and azelastine twice a day.  Also taking Zyrtec and Singulair regularly.  Uses Olopatadine eyedrops PRN but his nasal symptoms are most concerning for him.   Asthma: Reports doing okay overall.  He did have 1 episode since our initial visit in January where he had some shortness of breath and wheezing requiring albuterol daily for about 3 days.  He is not sure what caused this 1 but does not recall being ill.  He did not have to go to the urgent care or ER.  No oral prednisone.  He is taking Breztri 2 puffs twice daily.  GERD: Reports doing a lot better.  He tried Pepcid that we had discussed last visit but it did not help so he ended up doing a 14-day course of Prilosec which did help.  He denies having much issues now with the globus sensation, reflux or heartburn.    Past Medical History: Past Medical History:  Diagnosis Date   Asthma     Objective:  BP 132/70   Pulse 80   Temp 98 F (36.7 C)   Resp 20   Ht '5\' 11"'$  (1.803 m)   Wt 212 lb (96.2 kg)   SpO2 98%   BMI 29.57 kg/m  Body mass index is 29.57 kg/m. Physical Exam: GEN:  alert, well developed HEENT: clear conjunctiva, nose with moderate inferior turbinate hypertrophy, pink nasal mucosa, clear rhinorrhea, + cobblestoning HEART: regular rate and rhythm, no murmur LUNGS: clear to auscultation bilaterally, no coughing, unlabored respiration SKIN: no rashes or lesions   Spirometry:  Tracings reviewed. His effort: Good reproducible efforts. FVC: 3.81 L FEV1: 3.29 L, 80% predicted FEV1/FVC ratio: 86% Interpretation: Spirometry consistent with possible restrictive disease.  Please see scanned spirometry results for details.  Assessment:   1. Moderate persistent asthma without complication   2. Seasonal and perennial allergic rhinitis   3. Gastroesophageal reflux disease, unspecified whether esophagitis present   4. Globus sensation     Plan/Recommendations:  Allergic Rhinitis: - Remains uncontrolled despite maximal medical therapy. He discussed the CPT codes for AIT with insurance company and wishes to start. He has signed the consent forms.  - Positive skin test 03/2022: trees, grasses, weeds, mold, DM, cat, dog, horse, cockroach.  - Avoidance measures discussed. - Use nasal saline rinses before nose sprays such as with Neilmed Sinus Rinse.  Use distilled water.   - Use Flonase 2 sprays each nostril daily. Aim upward and outward. - Use Azelastine 2 sprays each nostril twice daily as needed. Aim upward and outward. - Use Zyrtec 10 mg daily.  - Use Singulair '10mg'$  daily.  Stop if there are any mood/behavioral  changes. - For eyes, use Olopatadine or Ketotifen 1 eye drop daily as needed for itchy, watery eyes.  Available over the counter, if not covered by insurance.  - We will place order for the allergy shots today.  Bring Epipen with you for each shot visit.  Do your best to stay on the shot schedule of once a week.    Moderate Persistent Asthma: - MDI technique discussed. Improved from prior, will continue Breztri.  Spirometry without obstruction today.   - Maintenance inhaler:  Continue Breztri 2 puffs twice daily with spacer. Brush and gargle after.  Continue Singulair '10mg'$  daily.  - Rescue inhaler: Albuterol 2 puffs via spacer or 1 vial via nebulizer every 4-6 hours as needed for respiratory symptoms of cough, shortness of breath, or wheezing Asthma control goals:  Full participation in all desired activities (may need albuterol before activity) Albuterol use two times or less a week on average (not counting use with activity) Cough interfering with sleep two times or less a month Oral steroids no more than once a year No hospitalizations  GERD/Globus Sensation -Improved with a course of Prilosec.  - Can consider ENT referral if persistent for globus sensation.  -Use Pepcid '20mg'$  daily as needed or if symptoms worsen, do a 4 week course of Prilosec '20mg'$  daily.  -Avoid lying down for at least two hours after a meal or after drinking acidic beverages, like soda, or other caffeinated beverages. This can help to prevent stomach contents from flowing back into the esophagus. -Keep your head elevated while you sleep. Using an extra pillow or two can also help to prevent reflux. -Eat smaller and more frequent meals each day instead of a few large meals. This promotes digestion and can aid in preventing heartburn. -Wear loose-fitting clothes to ease pressure on the stomach, which can worsen heartburn and reflux. -Reduce excess weight around the midsection. This can ease pressure on the stomach. Such pressure can force some stomach contents back up the esophagus.       Return in about 3 months (around 07/10/2022).  Harlon Flor, MD [facility]

## 2022-04-10 DIAGNOSIS — J3089 Other allergic rhinitis: Secondary | ICD-10-CM | POA: Diagnosis not present

## 2022-04-10 NOTE — Progress Notes (Signed)
VIALS EXP 04-10-23

## 2022-04-10 NOTE — Progress Notes (Signed)
Aeroallergen Immunotherapy   Ordering Provider: Harlon Flor   Patient Details  Name: Ronald Gibson  MRN: [id number]  Date of Birth: 09/19/1975   Order 1 of 2   Vial Label: pollen, DM, dog, cat, CR   0.3 ml (Volume)  BAU Concentration -- 7 Grass Mix* 100,000 ([address] [place], [place], [place], Perennial R[place], RedTop, Sweet Vernal, Timothy)  0.2 ml (Volume)  1:20 Concentration -- [place]  0.3 ml (Volume)  BAU Concentration -- [place] 10,000  0.2 ml (Volume)  1:20 Concentration -- [place]  0.5 ml (Volume)  1:20 Concentration -- Weed Mix*  0.5 ml (Volume)  1:20 Concentration -- Eastern 10 Tree Mix (also Sweet Gum)  0.2 ml (Volume)  1:10 Concentration -- Pecan Pollen  0.2 ml (Volume)  1:20 Concentration -- Walnut, Black Pollen  0.5 ml (Volume)  1:10 Concentration -- Cat Hair  0.3 ml (Volume)  1:20 Concentration -- Cockroach, German  0.5 ml (Volume)  1:10 Concentration -- Dog Epithelia  0.5 ml (Volume)   AU Concentration -- Mite Mix (DF 5,000 & DP 5,000)     4.2  ml Extract Subtotal  0.8  ml Diluent  5.0  ml Maintenance Total   Schedule:  B  Silver Vial (1:1,000,000): Schedule B (6 doses)  Blue Vial (1:100,000): Schedule B (6 doses)  Yellow Vial (1:10,000): Schedule B (6 doses)  Green Vial (1:1,000): Schedule B (6 doses)  Red Vial (1:100): Schedule A (10 doses)   Special Instructions: Bring Epipen

## 2022-04-10 NOTE — Progress Notes (Signed)
Aeroallergen Immunotherapy  Ordering Provider: Harlon Flor  Patient Details Name: JAMORRIS ORTON MRN: [id number] Date of Birth: 09-28-1975  Order 2 of 2  Vial Label: mold  0.2 ml (Volume)  1:10 Concentration -- Mucor plumbeus 0.2 ml (Volume)  1:10 Concentration -- Fusarium moniliforme 0.2 ml (Volume)  1:40 Concentration -- Botrytis cinerea 0.2 ml (Volume)  1:40 Concentration -- Epicoccum nigrum   0.8  ml Extract Subtotal 4.2  ml Diluent 5.0  ml Maintenance Total  Schedule:  B Silver Vial (1:1,000,000): Schedule B (6 doses) Blue Vial (1:100,000): Schedule B (6 doses) Yellow Vial (1:10,000): Schedule B (6 doses) Green Vial (1:1,000): Schedule B (6 doses) Red Vial (1:100): Schedule A (10 doses)  Special Instructions: Bring Epipen

## 2022-04-11 DIAGNOSIS — J302 Other seasonal allergic rhinitis: Secondary | ICD-10-CM | POA: Diagnosis not present

## 2022-05-02 ENCOUNTER — Ambulatory Visit (INDEPENDENT_AMBULATORY_CARE_PROVIDER_SITE_OTHER): Payer: Commercial Managed Care - PPO

## 2022-05-02 DIAGNOSIS — J309 Allergic rhinitis, unspecified: Secondary | ICD-10-CM

## 2022-05-02 NOTE — Progress Notes (Signed)
Immunotherapy   Patient Details  Name: JAKE IRIGOYEN MRN: [id number] Date of Birth: 01-16-1976  05/02/2022  Jamey Ripa started injections for  molds, pollens, cats, dogs, dust mites, and cockroaches. Following schedule: B  Frequency:1 time per week Epi-Pen:Epi-Pen Available  Consent signed previously and patient instructions given. Patient waited in the lobby for thirty minutes without an issue.   Julius Bowels 05/02/2022, 10:27 AM

## 2022-05-11 ENCOUNTER — Encounter: Payer: Self-pay | Admitting: Allergy & Immunology

## 2022-05-11 ENCOUNTER — Ambulatory Visit (INDEPENDENT_AMBULATORY_CARE_PROVIDER_SITE_OTHER): Payer: Commercial Managed Care - PPO | Admitting: Allergy & Immunology

## 2022-05-11 ENCOUNTER — Other Ambulatory Visit: Payer: Self-pay

## 2022-05-11 VITALS — BP 132/72 | HR 80 | Temp 98.3°F | Resp 20 | Ht 71.0 in | Wt 205.0 lb

## 2022-05-11 DIAGNOSIS — J4540 Moderate persistent asthma, uncomplicated: Secondary | ICD-10-CM | POA: Diagnosis not present

## 2022-05-11 MED ORDER — GUAIFENESIN-CODEINE 100-10 MG/5ML PO SOLN: 10.0000 mL | Freq: Three times a day (TID) | ORAL | 0 refills | Status: DC | PRN

## 2022-05-11 MED ORDER — PREDNISONE 10 MG PO TABS: ORAL_TABLET | ORAL | 0 refills | Status: DC

## 2022-05-11 NOTE — Patient Instructions (Signed)
Cough - Start a prednisone burst. - Start the cough medicine every 8 hours as needed. - Call us if you think you need an antibiotic. - Lung testing looked SUPERB. - Come back next week to restart the shots.   2. Follow up as scheduled.    Please inform us of any Emergency Department visits, hospitalizations, or changes in symptoms. Call us before going to the ED for breathing or allergy symptoms since we might be able to fit you in for a sick visit. Feel free to contact us anytime with any questions, problems, or concerns.  It was a pleasure to meet you today!  Websites that have reliable patient information: 1. American Academy of Asthma, Allergy, and Immunology: www.aaaai.org 2. Food Allergy Research and Education (FARE): foodallergy.org 3. Mothers of Asthmatics: http://www.asthmacommunitynetwork.org 4. American College of Allergy, Asthma, and Immunology: www.acaai.org   COVID-19 Vaccine Information can be found at: [email or web address] For questions related to vaccine distribution or appointments, please email [email or web address] or call [phone].   We realize that you might be concerned about having an allergic reaction to the COVID19 vaccines. To help with that concern, WE ARE OFFERING THE COVID19 VACCINES IN OUR OFFICE! Ask the front desk for dates!     "Like" [place] on Facebook and Instagram for our latest updates!      A healthy democracy works best when [employer] participate! Make sure you are registered to vote! If you have moved or changed any of your contact information, you will need to get this updated before voting!  In some cases, you MAY be able to register to vote online: [email or web address]

## 2022-05-11 NOTE — Progress Notes (Signed)
FOLLOW UP  Date of Service/Encounter:  05/11/22   Assessment:   Moderate persistent asthma, uncomplicated  Seasonal and perennial allergic rhinitis (trees, grasses, weeds, mold, dust mite, cat, dog, horse, and cockroach) - recently started allergen immunotherapy   GERD - with globus sensation  Plan/Recommendations:   Cough - likely allergic flare (spiro is normal)  - Start a prednisone burst. - Start the cough medicine every 8 hours as needed. - Call us if you think you need an antibiotic. - Lung testing looked SUPERB. - Come back next week to restart the shots.   2. Follow up as scheduled.    Subjective:   Ronald Gibson is a 47 y.o. male presenting today for follow up of  Chief Complaint  Patient presents with   Asthma    Pt states last Monday and Tuesday he started having some issues with whezzing and coughing he states he used his neb it helped some but not went a way.    Ronald Gibson has a history of the following: There are no problems to display for this patient.   History obtained from: chart review and patient.  Ronald Gibson is a 47 y.o. male presenting for a follow up visit.  He was last seen in March 2024.  At that time, he continued to have uncontrolled allergic rhinitis and decided to start allergen immunotherapy.  He had skin testing in February that was positive to trees, grasses, weeds, mold, dust mite, cat, dog, horse, and cockroach.  He was continued on Flonase as well as Astelin, Zyrtec, and Singulair.  For his asthma, his spirometry looked better.  He was continued on Breztri 2 puffs twice daily as well as the Singulair.  For his GERD, he was continued on Pepcid 20 mg daily as well as Prilosec 20 mg daily.  Since the last visit, he has continue to have allergy symptoms. He was in [place] over the weekend for a baseball tournament. His son plays for [employer]. He was fine when he was down there and was doing well. Then Wednesday of last week,  he got his first allergy shot and started having the situation with a rebound set of symptoms.   He remains on all of his medicaitons. He has not tried doubling up on on the antihistamine. The coughing is the worst part of everything.  He has some Lawyer.  Asthma/Respiratory Symptom History: He has been using his nebulizer at bay. He feels that heh as been breathing decently, but he report sthat this is thin productive cough.   He came to Dr. Allena Katz on [address]. PCP had already increase it to [place] from [facility].  He has never been on a biologic.  I do not see any complete blood count in the system. His breathing never really got bad until the past couple of years.   Allergic Rhinitis Symptom History: He did receive the 1 injection.  He was on allergy shots when he was much younger, but he did not stick with them.    Otherwise, there have been no changes to his past medical history, surgical history, family history, or social history.    Review of Systems  Constitutional: Negative.  Negative for fever, malaise/fatigue and weight loss.  HENT:  Positive for congestion. Negative for ear discharge and ear pain.        Positive for postnasal drip.  Eyes:  Negative for pain, discharge and redness.  Respiratory:  Positive for cough and sputum production.  Negative for shortness of breath and wheezing.   Cardiovascular: Negative.  Negative for chest pain and palpitations.  Gastrointestinal:  Negative for abdominal pain, heartburn, nausea and vomiting.  Skin: Negative.  Negative for itching and rash.  Neurological:  Negative for dizziness and headaches.  Endo/Heme/Allergies:  Positive for environmental allergies. Does not bruise/bleed easily.       Objective:   Blood pressure 132/72, pulse 80, temperature 98.3 F (36.8 C), resp. rate 20, height  (1.803 m), weight 205 lb (93 kg), SpO2 95 %. Body mass index is 28.59 kg/m.    Physical Exam Vitals reviewed.  Constitutional:       Appearance: He is well-developed.  HENT:     Head: Normocephalic and atraumatic.     Right Ear: Tympanic membrane, ear canal and external ear normal.     Left Ear: Tympanic membrane, ear canal and external ear normal.     Nose: Rhinorrhea present. No nasal deformity, septal deviation or mucosal edema.     Right Turbinates: Enlarged, swollen and pale.     Left Turbinates: Enlarged, swollen and pale.     Right Sinus: No maxillary sinus tenderness or frontal sinus tenderness.     Left Sinus: No maxillary sinus tenderness or frontal sinus tenderness.     Comments: No nasal polyps.    Mouth/Throat:     Mouth: Mucous membranes are not pale and not dry.     Pharynx: Uvula midline.  Eyes:     General: Lids are normal. No allergic shiner.       Right eye: No discharge.        Left eye: No discharge.     Conjunctiva/sclera: Conjunctivae normal.     Right eye: Right conjunctiva is not injected. No chemosis.    Left eye: Left conjunctiva is not injected. No chemosis.    Pupils: Pupils are equal, round, and reactive to light.  Cardiovascular:     Rate and Rhythm: Normal rate and regular rhythm.     Heart sounds: Normal heart sounds.  Pulmonary:     Effort: Pulmonary effort is normal. No tachypnea, accessory muscle usage or respiratory distress.     Breath sounds: Normal breath sounds. No wheezing, rhonchi or rales.  Chest:     Chest wall: No tenderness.  Lymphadenopathy:     Cervical: No cervical adenopathy.  Skin:    Coloration: Skin is not pale.     Findings: No abrasion, erythema, petechiae or rash. Rash is not papular, urticarial or vesicular.  Neurological:     Mental Status: He is alert.  Psychiatric:        Behavior: Behavior is cooperative.      Diagnostic studies:    Spirometry: results normal (FEV1: 3.24/77%, FVC: 4.00/75%, FEV1/FVC: 81%).    Spirometry consistent with normal pattern. This is stable compared to his last spirometry.   Allergy Studies: none        Malachi Bonds, MD  [facility]

## 2022-05-16 ENCOUNTER — Ambulatory Visit (INDEPENDENT_AMBULATORY_CARE_PROVIDER_SITE_OTHER): Payer: Commercial Managed Care - PPO

## 2022-05-16 DIAGNOSIS — J309 Allergic rhinitis, unspecified: Secondary | ICD-10-CM

## 2022-05-25 ENCOUNTER — Ambulatory Visit (INDEPENDENT_AMBULATORY_CARE_PROVIDER_SITE_OTHER): Payer: Commercial Managed Care - PPO

## 2022-05-25 DIAGNOSIS — J309 Allergic rhinitis, unspecified: Secondary | ICD-10-CM

## 2022-06-01 ENCOUNTER — Ambulatory Visit (INDEPENDENT_AMBULATORY_CARE_PROVIDER_SITE_OTHER): Payer: Commercial Managed Care - PPO

## 2022-06-01 DIAGNOSIS — J309 Allergic rhinitis, unspecified: Secondary | ICD-10-CM

## 2022-06-08 ENCOUNTER — Ambulatory Visit (INDEPENDENT_AMBULATORY_CARE_PROVIDER_SITE_OTHER): Payer: Commercial Managed Care - PPO

## 2022-06-08 DIAGNOSIS — J309 Allergic rhinitis, unspecified: Secondary | ICD-10-CM | POA: Diagnosis not present

## 2022-06-13 ENCOUNTER — Ambulatory Visit (INDEPENDENT_AMBULATORY_CARE_PROVIDER_SITE_OTHER): Payer: Commercial Managed Care - PPO

## 2022-06-13 DIAGNOSIS — J309 Allergic rhinitis, unspecified: Secondary | ICD-10-CM | POA: Diagnosis not present

## 2022-06-20 ENCOUNTER — Ambulatory Visit (INDEPENDENT_AMBULATORY_CARE_PROVIDER_SITE_OTHER): Payer: Commercial Managed Care - PPO

## 2022-06-20 DIAGNOSIS — J309 Allergic rhinitis, unspecified: Secondary | ICD-10-CM | POA: Diagnosis not present

## 2022-07-02 ENCOUNTER — Ambulatory Visit (INDEPENDENT_AMBULATORY_CARE_PROVIDER_SITE_OTHER): Payer: Commercial Managed Care - PPO | Admitting: Internal Medicine

## 2022-07-02 ENCOUNTER — Other Ambulatory Visit: Payer: Self-pay

## 2022-07-02 VITALS — BP 118/88 | HR 100 | Temp 98.6°F | Resp 18 | Ht 70.0 in | Wt 200.6 lb

## 2022-07-02 DIAGNOSIS — J309 Allergic rhinitis, unspecified: Secondary | ICD-10-CM

## 2022-07-02 DIAGNOSIS — J4540 Moderate persistent asthma, uncomplicated: Secondary | ICD-10-CM | POA: Diagnosis not present

## 2022-07-02 DIAGNOSIS — J3089 Other allergic rhinitis: Secondary | ICD-10-CM

## 2022-07-02 DIAGNOSIS — K219 Gastro-esophageal reflux disease without esophagitis: Secondary | ICD-10-CM

## 2022-07-02 MED ORDER — FLUTICASONE PROPIONATE 50 MCG/ACT NA SUSP: 2.0000 | Freq: Every day | NASAL | 5 refills | Status: DC

## 2022-07-02 MED ORDER — AZELASTINE HCL 0.1 % NA SOLN: 2.0000 | Freq: Two times a day (BID) | NASAL | 5 refills | Status: DC | PRN

## 2022-07-02 MED ORDER — MONTELUKAST SODIUM 10 MG PO TABS: 10.0000 mg | ORAL_TABLET | Freq: Every day | ORAL | 5 refills | Status: DC

## 2022-07-02 MED ORDER — ALBUTEROL SULFATE HFA 108 (90 BASE) MCG/ACT IN AERS: 2.0000 | INHALATION_SPRAY | Freq: Four times a day (QID) | RESPIRATORY_TRACT | 1 refills | Status: DC | PRN

## 2022-07-02 MED ORDER — BREZTRI AEROSPHERE 160-9-4.8 MCG/ACT IN AERO: 2.0000 | INHALATION_SPRAY | Freq: Two times a day (BID) | RESPIRATORY_TRACT | 5 refills | Status: DC

## 2022-07-02 MED ORDER — CETIRIZINE HCL 10 MG PO TABS: 10.0000 mg | ORAL_TABLET | Freq: Every day | ORAL | 5 refills | Status: DC

## 2022-07-02 NOTE — Addendum Note (Signed)
Addended by: Elsworth Soho on: 07/02/2022 05:02 PM   Modules accepted: Orders

## 2022-07-02 NOTE — Progress Notes (Signed)
FOLLOW UP Date of Service/Encounter:  07/02/22   Subjective:  Ronald Gibson (DOB: Jun 25, 1975) is a 47 y.o. male who [facility] for follow up for  allergic rhinoconjunctivitis, moderate persistent asthma, GERD.  History obtained from: chart review and patient. Last visit was with Dr. Dellis Anes on 05/11/2022 for cough, normal spirometry. Given prednisone burst.  Also taking Breztri and Singulair.    Asthma Control Test: ACT Total Score: 13.    Reports having another flare up about a few weeks ago requiring oral prednisone.  His family was sick with fever, cough, congestion and passed something to him.  He then developed wheezing, coughing, SOB and tried using albuterol nebs without relief.  He then saw another provider and was given oral prednisone for asthma flare up which has helped improve his symptoms; still with some lingering cough but much better.  Still taking Breztri 2 puffs BID and Singulair daily.  Rhinoconjunctivitis is doing okay. Not as much runny nose, congestion, drainage as he usually has this year since starting AIT. No issues with shots.  Has an Epipen.  Still using Flonase, Zyrtec, Singulair daily. Also PRN Azelastine. Not needing allergy eye drops.   GERD is doing okay. Not much heartburn or sour taste or AM cough.  Has PRN Pepcid.  Did not have to do a course of PPI.   Past Medical History: Past Medical History:  Diagnosis Date   Asthma     Objective:  BP 118/88   Pulse 100   Temp 98.6 F (37 C) (Temporal)   Resp 18   Ht 5\' 10"  (1.778 m)   Wt 200 lb 9.6 oz (91 kg)   SpO2 98%   BMI 28.78 kg/m  Body mass index is 28.78 kg/m. Physical Exam: GEN: alert, well developed HEENT: clear conjunctiva, nose with mild inferior turbinate hypertrophy, pink nasal mucosa, clear rhinorrhea HEART: regular rate and rhythm, no murmur LUNGS: clear to auscultation bilaterally, no coughing, unlabored respiration SKIN: no rashes or  lesions  Spirometry:  Tracings reviewed. His effort: Good reproducible efforts. FVC: 4.87L FEV1: 4.09L, 97% predicted FEV1/FVC ratio: 84% Interpretation: Spirometry consistent with normal pattern.  Please see scanned spirometry results for details.  Assessment:   1. Moderate persistent asthma without complication   2. Seasonal and perennial allergic rhinitis   3. Gastroesophageal reflux disease, unspecified whether esophagitis present   4. Allergic rhinitis, unspecified seasonality, unspecified trigger     Plan/Recommendations:  Allergic Rhinitis: - Controlled on AIT.  - Positive skin test 03/2022: trees, grasses, weeds, mold, DM, cat, dog, horse, cockroach.  - Avoidance measures discussed. - Use nasal saline rinses before nose sprays such as with Neilmed Sinus Rinse.  Use distilled water.   - Use Flonase 2 sprays each nostril daily. Aim upward and outward. - Use Azelastine 2 sprays each nostril twice daily as needed. Aim upward and outward. - Use Zyrtec 10 mg daily.  - Use Singulair 10mg  daily.  Stop if there are any mood/behavioral changes. - For eyes, use Olopatadine or Ketotifen 1 eye drop daily as needed for itchy, watery eyes.  Available over the counter, if not covered by insurance.  - Continue allergy shots on schedule. Keep Epipen.   Moderate Persistent Asthma: - Not well controlled.  Recently has received 2 courses of oral prednisone for flare ups.  Will obtain labwork CBC with diff and IgE for biologic therapy consideration.    - Maintenance inhaler:  Continue Breztri 160-9-4.23mcg 2  puffs twice daily with spacer. Brush and gargle after.  Continue Singulair 10mg  daily.  - Rescue inhaler: Albuterol 2 puffs via spacer or 1 vial via nebulizer every 4-6 hours as needed for respiratory symptoms of cough, shortness of breath, or wheezing Asthma control goals:  Full participation in all desired activities (may need albuterol before activity) Albuterol use two times or less a  week on average (not counting use with activity) Cough interfering with sleep two times or less a month Oral steroids no more than once a year No hospitalizations  GERD -Use Pepcid 20mg  daily as needed. -Avoid lying down for at least two hours after a meal or after drinking acidic beverages, like soda, or other caffeinated beverages. This can help to prevent stomach contents from flowing back into the esophagus. -Keep your head elevated while you sleep. Using an extra pillow or two can also help to prevent reflux. -Eat smaller and more frequent meals each day instead of a few large meals. This promotes digestion and can aid in preventing heartburn. -Wear loose-fitting clothes to ease pressure on the stomach, which can worsen heartburn and reflux. -Reduce excess weight around the midsection. This can ease pressure on the stomach. Such pressure can force some stomach contents back up the esophagus.      Return in about 3 months (around 10/02/2022).  Alesia Morin, MD [facility]

## 2022-07-02 NOTE — Patient Instructions (Addendum)
Allergic Rhinitis: - Positive skin test 03/2022: trees, grasses, weeds, mold, DM, cat, dog, horse, cockroach.  - Avoidance measures discussed. - Use nasal saline rinses before nose sprays such as with Neilmed Sinus Rinse.  Use distilled water.   - Use Flonase 2 sprays each nostril daily. Aim upward and outward. - Use Azelastine 2 sprays each nostril twice daily as needed. Aim upward and outward. - Use Zyrtec 10 mg daily.  - Use Singulair 10mg  daily.  Stop if there are any mood/behavioral changes. - For eyes, use Olopatadine or Ketotifen 1 eye drop daily as needed for itchy, watery eyes.  Available over the counter, if not covered by insurance.  - Continue allergy shots on schedule. Keep Epipen.   Moderate Persistent Asthma: - Will obtain labwork for biologic therapy.   - Maintenance inhaler:  Continue Breztri 160-9-4.4mcg 2 puffs twice daily with spacer. Brush and gargle after.  Continue Singulair 10mg  daily.  - Rescue inhaler: Albuterol 2 puffs via spacer or 1 vial via nebulizer every 4-6 hours as needed for respiratory symptoms of cough, shortness of breath, or wheezing Asthma control goals:  Full participation in all desired activities (may need albuterol before activity) Albuterol use two times or less a week on average (not counting use with activity) Cough interfering with sleep two times or less a month Oral steroids no more than once a year No hospitalizations  GERD -Use Pepcid 20mg  daily as needed. -Avoid lying down for at least two hours after a meal or after drinking acidic beverages, like soda, or other caffeinated beverages. This can help to prevent stomach contents from flowing back into the esophagus. -Keep your head elevated while you sleep. Using an extra pillow or two can also help to prevent reflux. -Eat smaller and more frequent meals each day instead of a few large meals. This promotes digestion and can aid in preventing heartburn. -Wear loose-fitting clothes to ease  pressure on the stomach, which can worsen heartburn and reflux. -Reduce excess weight around the midsection. This can ease pressure on the stomach. Such pressure can force some stomach contents back up the esophagus.

## 2022-07-09 LAB — CBC WITH DIFFERENTIAL/PLATELET
Basophils Absolute: 0 10*3/uL (ref 0.0–0.2)
Basos: 1 %
EOS (ABSOLUTE): 0.1 10*3/uL (ref 0.0–0.4)
Eos: 2 %
Hematocrit: 47.4 % (ref 37.5–51.0)
Hemoglobin: 16.1 g/dL (ref 13.0–17.7)
Immature Grans (Abs): 0 10*3/uL (ref 0.0–0.1)
Immature Granulocytes: 1 %
Lymphocytes Absolute: 1.7 10*3/uL (ref 0.7–3.1)
Lymphs: 26 %
MCH: 30.7 pg (ref 26.6–33.0)
MCHC: 34 g/dL (ref 31.5–35.7)
MCV: 90 fL (ref 79–97)
Monocytes Absolute: 0.4 10*3/uL (ref 0.1–0.9)
Monocytes: 6 %
Neutrophils Absolute: 4.3 10*3/uL (ref 1.4–7.0)
Neutrophils: 64 %
Platelets: 257 10*3/uL (ref 150–450)
RBC: 5.25 x10E6/uL (ref 4.14–5.80)
RDW: 12.9 % (ref 11.6–15.4)
WBC: 6.6 10*3/uL (ref 3.4–10.8)

## 2022-07-09 LAB — IGE: IgE (Immunoglobulin E), Serum: 194 IU/mL (ref 6–495)

## 2022-07-13 ENCOUNTER — Ambulatory Visit (INDEPENDENT_AMBULATORY_CARE_PROVIDER_SITE_OTHER): Payer: Commercial Managed Care - PPO

## 2022-07-13 DIAGNOSIS — J309 Allergic rhinitis, unspecified: Secondary | ICD-10-CM | POA: Diagnosis not present

## 2022-07-20 ENCOUNTER — Ambulatory Visit (INDEPENDENT_AMBULATORY_CARE_PROVIDER_SITE_OTHER): Payer: Commercial Managed Care - PPO

## 2022-07-20 DIAGNOSIS — J309 Allergic rhinitis, unspecified: Secondary | ICD-10-CM

## 2022-08-08 ENCOUNTER — Ambulatory Visit (INDEPENDENT_AMBULATORY_CARE_PROVIDER_SITE_OTHER): Payer: Commercial Managed Care - PPO

## 2022-08-08 DIAGNOSIS — J309 Allergic rhinitis, unspecified: Secondary | ICD-10-CM

## 2022-08-17 ENCOUNTER — Ambulatory Visit (INDEPENDENT_AMBULATORY_CARE_PROVIDER_SITE_OTHER): Payer: Commercial Managed Care - PPO

## 2022-08-17 DIAGNOSIS — J309 Allergic rhinitis, unspecified: Secondary | ICD-10-CM

## 2022-08-24 ENCOUNTER — Ambulatory Visit (INDEPENDENT_AMBULATORY_CARE_PROVIDER_SITE_OTHER): Payer: Commercial Managed Care - PPO

## 2022-08-24 DIAGNOSIS — J309 Allergic rhinitis, unspecified: Secondary | ICD-10-CM | POA: Diagnosis not present

## 2022-08-29 ENCOUNTER — Ambulatory Visit (INDEPENDENT_AMBULATORY_CARE_PROVIDER_SITE_OTHER): Payer: Commercial Managed Care - PPO

## 2022-08-29 DIAGNOSIS — J309 Allergic rhinitis, unspecified: Secondary | ICD-10-CM | POA: Diagnosis not present

## 2022-09-19 ENCOUNTER — Ambulatory Visit (INDEPENDENT_AMBULATORY_CARE_PROVIDER_SITE_OTHER): Payer: Commercial Managed Care - PPO

## 2022-09-19 DIAGNOSIS — J309 Allergic rhinitis, unspecified: Secondary | ICD-10-CM

## 2022-10-03 ENCOUNTER — Ambulatory Visit (INDEPENDENT_AMBULATORY_CARE_PROVIDER_SITE_OTHER): Payer: Commercial Managed Care - PPO

## 2022-10-03 DIAGNOSIS — J309 Allergic rhinitis, unspecified: Secondary | ICD-10-CM | POA: Diagnosis not present

## 2022-10-08 ENCOUNTER — Encounter: Payer: Self-pay | Admitting: Internal Medicine

## 2022-10-08 ENCOUNTER — Ambulatory Visit (INDEPENDENT_AMBULATORY_CARE_PROVIDER_SITE_OTHER): Payer: Commercial Managed Care - PPO | Admitting: Internal Medicine

## 2022-10-08 VITALS — BP 114/72 | HR 76 | Temp 98.1°F | Resp 16 | Wt 208.1 lb

## 2022-10-08 DIAGNOSIS — J4540 Moderate persistent asthma, uncomplicated: Secondary | ICD-10-CM | POA: Diagnosis not present

## 2022-10-08 DIAGNOSIS — J3089 Other allergic rhinitis: Secondary | ICD-10-CM | POA: Diagnosis not present

## 2022-10-08 DIAGNOSIS — J302 Other seasonal allergic rhinitis: Secondary | ICD-10-CM

## 2022-10-08 DIAGNOSIS — K219 Gastro-esophageal reflux disease without esophagitis: Secondary | ICD-10-CM

## 2022-10-08 DIAGNOSIS — J309 Allergic rhinitis, unspecified: Secondary | ICD-10-CM

## 2022-10-08 MED ORDER — FLUTICASONE PROPIONATE 50 MCG/ACT NA SUSP: 2.0000 | Freq: Every day | NASAL | 5 refills | Status: DC

## 2022-10-08 MED ORDER — ALBUTEROL SULFATE HFA 108 (90 BASE) MCG/ACT IN AERS: 2.0000 | INHALATION_SPRAY | Freq: Four times a day (QID) | RESPIRATORY_TRACT | 1 refills | Status: DC | PRN

## 2022-10-08 MED ORDER — AZELASTINE HCL 0.1 % NA SOLN: 2.0000 | Freq: Two times a day (BID) | NASAL | 5 refills | Status: DC | PRN

## 2022-10-08 MED ORDER — MONTELUKAST SODIUM 10 MG PO TABS: 10.0000 mg | ORAL_TABLET | Freq: Every day | ORAL | 5 refills | Status: DC

## 2022-10-08 MED ORDER — BREZTRI AEROSPHERE 160-9-4.8 MCG/ACT IN AERO: 2.0000 | INHALATION_SPRAY | Freq: Two times a day (BID) | RESPIRATORY_TRACT | 5 refills | Status: DC

## 2022-10-08 MED ORDER — CETIRIZINE HCL 10 MG PO TABS: 10.0000 mg | ORAL_TABLET | Freq: Every day | ORAL | 5 refills | Status: DC

## 2022-10-08 NOTE — Progress Notes (Signed)
FOLLOW UP Date of Service/Encounter:  10/08/22   Subjective:  Ronald Gibson (DOB: 03/10/1975) is a 47 y.o. male who [facility] for follow up for allergic rhinoconjunctivitis, moderate persistent asthma, GERD.   History obtained from: chart review and patient. Last visit was with me on 07/02/2022 and at the time, was having trouble with allergies and asthma flare up.  On AIT. Discussed continuing Breztri and consider biologic.   Asthma: Asthma Control Test: ACT Total Score: 22.   Since last visit, he has been doing well.  He has not had to use albuterol at all.  On Breztri and Singulair.  No ER visits/oral prednisone.  Does sometimes have shortness of breath with significant increase in activity; he works as Midwife.  He does not do much aerobic activity so we did discuss adding this into his routine.  Allergies: Doing well overall.  Not much congestion, drainage, runny nose. Sometimes when he leans forward, he has lots of rhinorrhea.  Doing well on AIT.  No reactions.  Uses Singulair, Zyrtec and Flonase daily.  GERD: Doing much better with Pepcid; requires it almost daily.  Sometimes has heartburn if he eats certain foods.   Past Medical History: Past Medical History:  Diagnosis Date   Asthma     Objective:  BP 114/72   Pulse 76   Temp 98.1 F (36.7 C)   Resp 16   Wt 208 lb 2 oz (94.4 kg)   SpO2 97%   BMI 29.86 kg/m  Body mass index is 29.86 kg/m. Physical Exam: GEN: alert, well developed HEENT: clear conjunctiva, TM grey and translucent, nose with moderate inferior turbinate hypertrophy, pink nasal mucosa, no rhinorrhea, no cobblestoning HEART: regular rate and rhythm, no murmur LUNGS: clear to auscultation bilaterally, no coughing, unlabored respiration SKIN: no rashes or lesions  Spirometry:  Tracings reviewed. His effort: Good reproducible efforts. FVC: 4.48L FEV1: 3.71L, 91% predicted FEV1/FVC ratio:  83% Interpretation: Spirometry consistent with normal pattern.  Please see scanned spirometry results for details.   Assessment:   1. Moderate persistent asthma without complication   2. Gastroesophageal reflux disease, unspecified whether esophagitis present   3. Seasonal and perennial allergic rhinitis   4. Allergic rhinitis, unspecified seasonality, unspecified trigger     Plan/Recommendations:  Allergic Rhinitis: - Controlled - Positive skin test 03/2022: trees, grasses, weeds, mold, DM, cat, dog, horse, cockroach.  - Avoidance measures discussed. - Use nasal saline rinses before nose sprays such as with Neilmed Sinus Rinse.  Use distilled water.   - Use Flonase 2 sprays each nostril daily. Aim upward and outward. - Use Azelastine 2 sprays each nostril twice daily as needed. Aim upward and outward. - Use Zyrtec 10 mg daily.  - Use Singulair 10mg  daily.  Stop if there are any mood/behavioral changes. - For eyes, use Olopatadine or Ketotifen 1 eye drop daily as needed for itchy, watery eyes.  Available over the counter, if not covered by insurance.  - Continue allergy shots on schedule. Keep Epipen.   Moderate Persistent Asthma: - Well controlled; normal spirometry today. Low threshold for biologic. Have discussed Tezspire in the past.  - Maintenance inhaler:  Continue Breztri 160-9-4.64mcg 2 puffs twice daily with spacer. Brush and gargle after.  Continue Singulair 10mg  daily.  - Rescue inhaler: Albuterol 2 puffs via spacer or 1 vial via nebulizer every 4-6 hours as needed for respiratory symptoms of cough, shortness of breath, or wheezing Asthma control goals:  Full participation in all desired activities (may need albuterol before activity) Albuterol use two times or less a week on average (not counting use with activity) Cough interfering with sleep two times or less a month Oral steroids no more than once a year No hospitalizations  GERD - Improved -Use Pepcid 20mg  daily  as needed. -Avoid lying down for at least two hours after a meal or after drinking acidic beverages, like soda, or other caffeinated beverages. This can help to prevent stomach contents from flowing back into the esophagus. -Keep your head elevated while you sleep. Using an extra pillow or two can also help to prevent reflux. -Eat smaller and more frequent meals each day instead of a few large meals. This promotes digestion and can aid in preventing heartburn. -Wear loose-fitting clothes to ease pressure on the stomach, which can worsen heartburn and reflux. -Reduce excess weight around the midsection. This can ease pressure on the stomach. Such pressure can force some stomach contents back up the esophagus.     Return in about 4 months (around 02/07/2023).  Alesia Morin, MD [facility]

## 2022-10-08 NOTE — Patient Instructions (Addendum)
Allergic Rhinitis: - Positive skin test 03/2022: trees, grasses, weeds, mold, DM, cat, dog, horse, cockroach.  - Avoidance measures discussed. - Use nasal saline rinses before nose sprays such as with Neilmed Sinus Rinse.  Use distilled water.   - Use Flonase 2 sprays each nostril daily. Aim upward and outward. - Use Azelastine 2 sprays each nostril twice daily as needed. Aim upward and outward. - Use Zyrtec 10 mg daily.  - Use Singulair 10mg  daily.  Stop if there are any mood/behavioral changes. - For eyes, use Olopatadine or Ketotifen 1 eye drop daily as needed for itchy, watery eyes.  Available over the counter, if not covered by insurance.  - Continue allergy shots on schedule. Keep Epipen.   Moderate Persistent Asthma: - Maintenance inhaler:  Continue Breztri 160-9-4.43mcg 2 puffs twice daily with spacer. Brush and gargle after.  Continue Singulair 10mg  daily.  - Rescue inhaler: Albuterol 2 puffs via spacer or 1 vial via nebulizer every 4-6 hours as needed for respiratory symptoms of cough, shortness of breath, or wheezing Asthma control goals:  Full participation in all desired activities (may need albuterol before activity) Albuterol use two times or less a week on average (not counting use with activity) Cough interfering with sleep two times or less a month Oral steroids no more than once a year No hospitalizations  GERD -Use Pepcid 20mg  daily as needed. -Avoid lying down for at least two hours after a meal or after drinking acidic beverages, like soda, or other caffeinated beverages. This can help to prevent stomach contents from flowing back into the esophagus. -Keep your head elevated while you sleep. Using an extra pillow or two can also help to prevent reflux. -Eat smaller and more frequent meals each day instead of a few large meals. This promotes digestion and can aid in preventing heartburn. -Wear loose-fitting clothes to ease pressure on the stomach, which can worsen  heartburn and reflux. -Reduce excess weight around the midsection. This can ease pressure on the stomach. Such pressure can force some stomach contents back up the esophagus.

## 2022-10-08 NOTE — Addendum Note (Signed)
Addended by: Elsworth Soho on: 10/08/2022 05:12 PM   Modules accepted: Orders

## 2022-10-19 ENCOUNTER — Ambulatory Visit (INDEPENDENT_AMBULATORY_CARE_PROVIDER_SITE_OTHER): Payer: Commercial Managed Care - PPO

## 2022-10-19 DIAGNOSIS — J309 Allergic rhinitis, unspecified: Secondary | ICD-10-CM

## 2022-10-24 ENCOUNTER — Ambulatory Visit (INDEPENDENT_AMBULATORY_CARE_PROVIDER_SITE_OTHER): Payer: Commercial Managed Care - PPO

## 2022-10-24 DIAGNOSIS — J309 Allergic rhinitis, unspecified: Secondary | ICD-10-CM

## 2022-10-31 ENCOUNTER — Ambulatory Visit (INDEPENDENT_AMBULATORY_CARE_PROVIDER_SITE_OTHER): Payer: Commercial Managed Care - PPO

## 2022-10-31 DIAGNOSIS — J309 Allergic rhinitis, unspecified: Secondary | ICD-10-CM | POA: Diagnosis not present

## 2022-11-14 ENCOUNTER — Ambulatory Visit (INDEPENDENT_AMBULATORY_CARE_PROVIDER_SITE_OTHER): Payer: Commercial Managed Care - PPO

## 2022-11-14 DIAGNOSIS — J309 Allergic rhinitis, unspecified: Secondary | ICD-10-CM | POA: Diagnosis not present

## 2022-11-21 ENCOUNTER — Ambulatory Visit (INDEPENDENT_AMBULATORY_CARE_PROVIDER_SITE_OTHER): Payer: Commercial Managed Care - PPO

## 2022-11-21 DIAGNOSIS — J309 Allergic rhinitis, unspecified: Secondary | ICD-10-CM | POA: Diagnosis not present

## 2022-11-28 ENCOUNTER — Ambulatory Visit (INDEPENDENT_AMBULATORY_CARE_PROVIDER_SITE_OTHER): Payer: Commercial Managed Care - PPO

## 2022-11-28 DIAGNOSIS — J309 Allergic rhinitis, unspecified: Secondary | ICD-10-CM | POA: Diagnosis not present

## 2022-12-07 ENCOUNTER — Ambulatory Visit (INDEPENDENT_AMBULATORY_CARE_PROVIDER_SITE_OTHER): Payer: Commercial Managed Care - PPO

## 2022-12-07 DIAGNOSIS — J309 Allergic rhinitis, unspecified: Secondary | ICD-10-CM | POA: Diagnosis not present

## 2022-12-14 ENCOUNTER — Ambulatory Visit (INDEPENDENT_AMBULATORY_CARE_PROVIDER_SITE_OTHER): Payer: Commercial Managed Care - PPO

## 2022-12-14 DIAGNOSIS — J309 Allergic rhinitis, unspecified: Secondary | ICD-10-CM | POA: Diagnosis not present

## 2022-12-19 ENCOUNTER — Ambulatory Visit (INDEPENDENT_AMBULATORY_CARE_PROVIDER_SITE_OTHER): Payer: Commercial Managed Care - PPO

## 2022-12-19 DIAGNOSIS — J309 Allergic rhinitis, unspecified: Secondary | ICD-10-CM

## 2022-12-26 ENCOUNTER — Ambulatory Visit (INDEPENDENT_AMBULATORY_CARE_PROVIDER_SITE_OTHER): Payer: Commercial Managed Care - PPO | Admitting: *Deleted

## 2022-12-26 DIAGNOSIS — J309 Allergic rhinitis, unspecified: Secondary | ICD-10-CM | POA: Diagnosis not present

## 2023-01-02 ENCOUNTER — Ambulatory Visit (INDEPENDENT_AMBULATORY_CARE_PROVIDER_SITE_OTHER): Payer: Commercial Managed Care - PPO

## 2023-01-02 DIAGNOSIS — J309 Allergic rhinitis, unspecified: Secondary | ICD-10-CM | POA: Diagnosis not present

## 2023-01-14 ENCOUNTER — Telehealth: Payer: Self-pay

## 2023-01-14 MED ORDER — AZELASTINE HCL 0.1 % NA SOLN: 2.0000 | Freq: Two times a day (BID) | NASAL | 0 refills | Status: DC | PRN

## 2023-01-14 MED ORDER — MONTELUKAST SODIUM 10 MG PO TABS: 10.0000 mg | ORAL_TABLET | Freq: Every day | ORAL | 0 refills | Status: DC

## 2023-01-14 MED ORDER — FLUTICASONE PROPIONATE 50 MCG/ACT NA SUSP: 2.0000 | Freq: Every day | NASAL | 0 refills | Status: DC

## 2023-01-14 MED ORDER — CETIRIZINE HCL 10 MG PO TABS: 10.0000 mg | ORAL_TABLET | Freq: Every day | ORAL | 0 refills | Status: DC

## 2023-01-14 NOTE — Telephone Encounter (Signed)
I called patient and left a message to callback. Med[facility]

## 2023-01-14 NOTE — Telephone Encounter (Signed)
Patient called to change his prescriptions to [facility].  Please send the Cetirizine, Singulair, Flonase & Azelastine, per patient request.

## 2023-01-15 MED ORDER — MONTELUKAST SODIUM 10 MG PO TABS: 10.0000 mg | ORAL_TABLET | Freq: Every day | ORAL | 0 refills | Status: DC

## 2023-01-15 MED ORDER — CETIRIZINE HCL 10 MG PO TABS: 10.0000 mg | ORAL_TABLET | Freq: Every day | ORAL | 0 refills | Status: DC

## 2023-01-15 MED ORDER — FLUTICASONE PROPIONATE 50 MCG/ACT NA SUSP: 2.0000 | Freq: Every day | NASAL | 0 refills | Status: DC

## 2023-01-15 MED ORDER — AZELASTINE HCL 0.1 % NA SOLN: 2.0000 | Freq: Two times a day (BID) | NASAL | 0 refills | Status: DC | PRN

## 2023-01-15 NOTE — Telephone Encounter (Signed)
I called patient and informed medications have been sent in. Patient informed me that he wants 90 day supply. I informed to keep upcoming appt. 90 day supply has been sent in.

## 2023-01-15 NOTE — Addendum Note (Signed)
Addended by: Elsworth Soho on: 01/15/2023 02:45 PM   Modules accepted: Orders

## 2023-01-18 ENCOUNTER — Ambulatory Visit (INDEPENDENT_AMBULATORY_CARE_PROVIDER_SITE_OTHER): Payer: Commercial Managed Care - PPO

## 2023-01-18 DIAGNOSIS — J309 Allergic rhinitis, unspecified: Secondary | ICD-10-CM

## 2023-01-21 ENCOUNTER — Ambulatory Visit (INDEPENDENT_AMBULATORY_CARE_PROVIDER_SITE_OTHER): Payer: Commercial Managed Care - PPO

## 2023-01-21 DIAGNOSIS — J309 Allergic rhinitis, unspecified: Secondary | ICD-10-CM

## 2023-02-01 ENCOUNTER — Ambulatory Visit (INDEPENDENT_AMBULATORY_CARE_PROVIDER_SITE_OTHER): Payer: Commercial Managed Care - PPO

## 2023-02-01 DIAGNOSIS — J309 Allergic rhinitis, unspecified: Secondary | ICD-10-CM

## 2023-02-11 ENCOUNTER — Ambulatory Visit (INDEPENDENT_AMBULATORY_CARE_PROVIDER_SITE_OTHER): Payer: Commercial Managed Care - PPO | Admitting: Internal Medicine

## 2023-02-11 VITALS — BP 112/86 | HR 78 | Temp 98.0°F | Resp 16 | Ht 69.8 in | Wt 207.0 lb

## 2023-02-11 DIAGNOSIS — J302 Other seasonal allergic rhinitis: Secondary | ICD-10-CM | POA: Diagnosis not present

## 2023-02-11 DIAGNOSIS — K219 Gastro-esophageal reflux disease without esophagitis: Secondary | ICD-10-CM | POA: Diagnosis not present

## 2023-02-11 DIAGNOSIS — J4540 Moderate persistent asthma, uncomplicated: Secondary | ICD-10-CM

## 2023-02-11 DIAGNOSIS — J3089 Other allergic rhinitis: Secondary | ICD-10-CM

## 2023-02-11 MED ORDER — EPINEPHRINE 0.3 MG/0.3ML IJ SOAJ: 0.3000 mg | INTRAMUSCULAR | 1 refills | Status: DC | PRN

## 2023-02-11 MED ORDER — AZELASTINE HCL 0.1 % NA SOLN: 2.0000 | Freq: Two times a day (BID) | NASAL | 1 refills | Status: DC | PRN

## 2023-02-11 MED ORDER — BREZTRI AEROSPHERE 160-9-4.8 MCG/ACT IN AERO: 2.0000 | INHALATION_SPRAY | Freq: Two times a day (BID) | RESPIRATORY_TRACT | 1 refills | Status: DC

## 2023-02-11 MED ORDER — FLUTICASONE PROPIONATE 50 MCG/ACT NA SUSP: 2.0000 | Freq: Every day | NASAL | 1 refills | Status: DC

## 2023-02-11 MED ORDER — CETIRIZINE HCL 10 MG PO TABS: 10.0000 mg | ORAL_TABLET | Freq: Every day | ORAL | 1 refills | Status: DC | PRN

## 2023-02-11 MED ORDER — ALBUTEROL SULFATE HFA 108 (90 BASE) MCG/ACT IN AERS: 2.0000 | INHALATION_SPRAY | Freq: Four times a day (QID) | RESPIRATORY_TRACT | 1 refills | Status: DC | PRN

## 2023-02-11 NOTE — Patient Instructions (Addendum)
 Allergic Rhinitis: - Positive skin test 03/2022: trees, grasses, weeds, mold, DM, cat, dog, horse, cockroach.  - Avoidance measures discussed. - Use nasal saline rinses before nose sprays such as with Neilmed Sinus Rinse.  Use distilled water.   - Use Flonase  2 sprays each nostril daily. Aim upward and outward. - Use Azelastine  2 sprays each nostril twice daily as needed for congestion, drainage, runny nose. Aim upward and outward. - Use Zyrtec  10 mg daily.  - For eyes, use Olopatadine  or Ketotifen 1 eye drop daily as needed for itchy, watery eyes.  Available over the counter, if not covered by insurance.  - Continue allergy  shots on schedule. Keep Epipen . Initiated 04/2021. Reached red vial 11/2022.    Moderate Persistent Asthma: - Maintenance inhaler:  Continue Breztri  160-9-4.17mcg 2 puffs twice daily with spacer. Brush and gargle after.  Stop Singulair  10mg  daily.  - Rescue inhaler: Albuterol  2 puffs via spacer or 1 vial via nebulizer every 4-6 hours as needed for respiratory symptoms of cough, shortness of breath, or wheezing Asthma control goals:  Full participation in all desired activities (may need albuterol  before activity) Albuterol  use two times or less a week on average (not counting use with activity) Cough interfering with sleep two times or less a month Oral steroids no more than once a year No hospitalizations  GERD -Use Pepcid  20mg  daily as needed. -Avoid lying down for at least two hours after a meal or after drinking acidic beverages, like soda, or other caffeinated beverages. This can help to prevent stomach contents from flowing back into the esophagus. -Keep your head elevated while you sleep. Using an extra pillow or two can also help to prevent reflux. -Eat smaller and more frequent meals each day instead of a few large meals. This promotes digestion and can aid in preventing heartburn. -Wear loose-fitting clothes to ease pressure on the stomach, which can worsen  heartburn and reflux. -Reduce excess weight around the midsection. This can ease pressure on the stomach. Such pressure can force some stomach contents back up the esophagus.

## 2023-02-11 NOTE — Progress Notes (Signed)
 FOLLOW UP Date of Service/Encounter:  02/11/23   Subjective:  Ronald Gibson (DOB: 1975-03-27) is a 48 y.o. male who [facility] for follow up for asthma, allergic rhinitis and GERD.   History obtained from: chart review and patient. Last visit was with me on 10/08/2022 and at the time was well controlled on Breztri , Singulair  Flonase , PRN Azelastine , Zyrtec  and Pepcid .   Since last visit, asthma has done very well.  He rarely has to use Albuterol , last use was several months ago.  Does not get SOB/chest tightness/wheezing.  Using Breztri  2 puffs BID and Singulair  daily. No ER visits/oral prednisone  either.   He would be interested in stepping down therapy.  Allergies are better too. Not as much congestion, drainage, runny nose, sneezing  Does have some mucoid drainage in mornings.  Using Flonase , Azelastine , Zyrtec  daily.   Reflux is controlled mostly by lifestyle measures.  He watches what he eats and does not sleep after dinner.  Rarely needs Pepcid .  Past Medical History: Past Medical History:  Diagnosis Date   Asthma     Objective:  BP 112/86   Pulse 78   Temp 98 F (36.7 C) (Temporal)   Resp 16   Ht 5' 9.8 (1.773 m)   Wt 207 lb (93.9 kg)   SpO2 97%   BMI 29.87 kg/m  Body mass index is 29.87 kg/m. Physical Exam: GEN: alert, well developed HEENT: clear conjunctiva, nose with mild inferior turbinate hypertrophy, pink nasal mucosa, no rhinorrhea, no cobblestoning HEART: regular rate and rhythm, no murmur LUNGS: clear to auscultation bilaterally, no coughing, unlabored respiration SKIN: no rashes or lesions  Spirometry:  Tracings reviewed. His effort: Good reproducible efforts. FVC: 4.56L, 135% predicted  FEV1: 3.96L, 142% predicted FEV1/FVC ratio: 87% Interpretation: Spirometry consistent with normal pattern.  Please see scanned spirometry results for details.   Assessment:   1. Gastroesophageal reflux disease,  unspecified whether esophagitis present   2. Seasonal and perennial allergic rhinitis   3. Moderate persistent asthma without complication     Plan/Recommendations:  Allergic Rhinitis: - Improved with AIT, discussed Azelastine  can be PRN.  - Positive skin test 03/2022: trees, grasses, weeds, mold, DM, cat, dog, horse, cockroach.  - Avoidance measures discussed. - Use nasal saline rinses before nose sprays such as with Neilmed Sinus Rinse.  Use distilled water.   - Use Flonase  2 sprays each nostril daily. Aim upward and outward. - Use Azelastine  2 sprays each nostril twice daily as needed for congestion, drainage, runny nose. Aim upward and outward. - Use Zyrtec  10 mg daily.  - For eyes, use Olopatadine  or Ketotifen 1 eye drop daily as needed for itchy, watery eyes.  Available over the counter, if not covered by insurance.  - Continue allergy  shots on schedule. Keep Epipen . Initiated 04/2021. Reached red vial 11/2022.    Moderate Persistent Asthma: - Well controlled, normal spirometry.  Lets try stopping Singulair .  If he remains controlled at next visit, will discuss stepping down from Breztri .  - Maintenance inhaler:  Continue Breztri  160-9-4.46mcg 2 puffs twice daily with spacer. Brush and gargle after.  Stop Singulair  10mg  daily.  - Rescue inhaler: Albuterol  2 puffs via spacer or 1 vial via nebulizer every 4-6 hours as needed for respiratory symptoms of cough, shortness of breath, or wheezing Asthma control goals:  Full participation in all desired activities (may need albuterol  before activity) Albuterol  use two times or less a week on average (not  counting use with activity) Cough interfering with sleep two times or less a month Oral steroids no more than once a year No hospitalizations  GERD - Well Controlled.  -Use Pepcid  20mg  daily as needed. -Avoid lying down for at least two hours after a meal or after drinking acidic beverages, like soda, or other caffeinated beverages. This  can help to prevent stomach contents from flowing back into the esophagus. -Keep your head elevated while you sleep. Using an extra pillow or two can also help to prevent reflux. -Eat smaller and more frequent meals each day instead of a few large meals. This promotes digestion and can aid in preventing heartburn. -Wear loose-fitting clothes to ease pressure on the stomach, which can worsen heartburn and reflux. -Reduce excess weight around the midsection. This can ease pressure on the stomach. Such pressure can force some stomach contents back up the esophagus.     Return in about 4 months (around 06/11/2023).  Arleta Blanch, MD [facility] 

## 2023-02-22 ENCOUNTER — Ambulatory Visit (INDEPENDENT_AMBULATORY_CARE_PROVIDER_SITE_OTHER): Payer: Commercial Managed Care - PPO

## 2023-02-22 DIAGNOSIS — J309 Allergic rhinitis, unspecified: Secondary | ICD-10-CM

## 2023-03-01 ENCOUNTER — Ambulatory Visit (INDEPENDENT_AMBULATORY_CARE_PROVIDER_SITE_OTHER): Payer: Commercial Managed Care - PPO

## 2023-03-01 DIAGNOSIS — J309 Allergic rhinitis, unspecified: Secondary | ICD-10-CM | POA: Diagnosis not present

## 2023-03-15 ENCOUNTER — Ambulatory Visit (INDEPENDENT_AMBULATORY_CARE_PROVIDER_SITE_OTHER): Payer: Commercial Managed Care - PPO | Admitting: *Deleted

## 2023-03-15 DIAGNOSIS — J309 Allergic rhinitis, unspecified: Secondary | ICD-10-CM

## 2023-03-22 ENCOUNTER — Ambulatory Visit (INDEPENDENT_AMBULATORY_CARE_PROVIDER_SITE_OTHER): Payer: Commercial Managed Care - PPO

## 2023-03-22 DIAGNOSIS — J309 Allergic rhinitis, unspecified: Secondary | ICD-10-CM

## 2023-04-05 ENCOUNTER — Ambulatory Visit (INDEPENDENT_AMBULATORY_CARE_PROVIDER_SITE_OTHER): Payer: Self-pay

## 2023-04-05 DIAGNOSIS — J309 Allergic rhinitis, unspecified: Secondary | ICD-10-CM | POA: Diagnosis not present

## 2023-04-05 MED ORDER — BREZTRI AEROSPHERE 160-9-4.8 MCG/ACT IN AERO: 2.0000 | INHALATION_SPRAY | Freq: Two times a day (BID) | RESPIRATORY_TRACT | 0 refills | Status: DC

## 2023-04-11 DIAGNOSIS — J3081 Allergic rhinitis due to animal (cat) (dog) hair and dander: Secondary | ICD-10-CM | POA: Diagnosis not present

## 2023-04-11 NOTE — Progress Notes (Signed)
 VIALS MADE 04-11-23. EXP 04-10-24

## 2023-04-12 ENCOUNTER — Ambulatory Visit: Payer: Self-pay

## 2023-04-12 DIAGNOSIS — J302 Other seasonal allergic rhinitis: Secondary | ICD-10-CM | POA: Diagnosis not present

## 2023-04-17 ENCOUNTER — Ambulatory Visit (INDEPENDENT_AMBULATORY_CARE_PROVIDER_SITE_OTHER)

## 2023-04-17 DIAGNOSIS — J309 Allergic rhinitis, unspecified: Secondary | ICD-10-CM

## 2023-04-23 ENCOUNTER — Other Ambulatory Visit: Payer: Self-pay | Admitting: Internal Medicine

## 2023-04-26 ENCOUNTER — Ambulatory Visit (INDEPENDENT_AMBULATORY_CARE_PROVIDER_SITE_OTHER): Payer: Self-pay

## 2023-04-26 DIAGNOSIS — J309 Allergic rhinitis, unspecified: Secondary | ICD-10-CM

## 2023-05-01 ENCOUNTER — Ambulatory Visit (INDEPENDENT_AMBULATORY_CARE_PROVIDER_SITE_OTHER)

## 2023-05-01 DIAGNOSIS — J309 Allergic rhinitis, unspecified: Secondary | ICD-10-CM

## 2023-05-10 ENCOUNTER — Ambulatory Visit (INDEPENDENT_AMBULATORY_CARE_PROVIDER_SITE_OTHER)

## 2023-05-10 DIAGNOSIS — J309 Allergic rhinitis, unspecified: Secondary | ICD-10-CM | POA: Diagnosis not present

## 2023-05-24 ENCOUNTER — Ambulatory Visit (INDEPENDENT_AMBULATORY_CARE_PROVIDER_SITE_OTHER): Payer: Self-pay

## 2023-05-24 DIAGNOSIS — J309 Allergic rhinitis, unspecified: Secondary | ICD-10-CM

## 2023-05-31 ENCOUNTER — Ambulatory Visit (INDEPENDENT_AMBULATORY_CARE_PROVIDER_SITE_OTHER)

## 2023-05-31 DIAGNOSIS — J309 Allergic rhinitis, unspecified: Secondary | ICD-10-CM | POA: Diagnosis not present

## 2023-06-12 ENCOUNTER — Ambulatory Visit (INDEPENDENT_AMBULATORY_CARE_PROVIDER_SITE_OTHER)

## 2023-06-12 DIAGNOSIS — J309 Allergic rhinitis, unspecified: Secondary | ICD-10-CM

## 2023-06-16 NOTE — Progress Notes (Signed)
 Follow-up thank you got a message that you follow-up/promo [id number] [place] [id number] Dept: [phone]  FOLLOW UP NOTE  Patient ID: Ronald Gibson, male    DOB: [birth year]  Age: 48 y.o. MRN: [id number] Date of Office Visit: 06/17/2023  Assessment  Chief Complaint: Allergic Rhinitis  (Head congestion roughly about 4 month ago. Nasal congestion this week, taken claritin to relieve the symtoms.)  HPI Ronald Gibson is a 48 year old male who presents to the clinic for follow-up visit.  He was last seen in this clinic on 02/11/2023 by Dr. Lydia Sams for evaluation of asthma, allergic rhinitis, allergic conjunctivitis, and reflux.   At today's visit, he reports that he has been feeling chest tightness, wheezing, and cough producing mucus for about 1 week.  He continues Breztri  2 puffs twice a day and occasionally uses albuterol  with relief of symptoms.  He did stop montelukast  on 02/11/2023.  He notes that montelukast  was not causing any adverse reaction, however, he was reducing his medication burden.  We briefly discussed tezspire for asthma control and written information was provided.  Allergic rhinitis is reported as moderately well-controlled with symptoms including clear rhinorrhea, nasal congestion, and postnasal drainage.  He continues cetirizine  10 mg once a day, Flonase  nasal spray daily, and azelastine  as needed.  He is not currently using nasal saline rinses.  He reports poor Flonase  application technique.  He began allergen immunotherapy directed toward grass pollen, tree pollen, weed pollen, mold, dust mite, cat, dog, and cockroach on 05/02/2022.  He continues allergen immunotherapy with no large or local reactions.  He reports a significant decrease in his symptoms of allergic rhinitis while continuing on allergen immunotherapy.  EpiPen  set is up-to-date.  Allergic conjunctivitis is reported as well-controlled with no medical intervention at this time.  Reflux is  reported as well-controlled with no symptoms including heartburn or vomiting.  He is not currently taking famotidine  and symptoms are well-controlled.  His current medications are listed in the chart.  Drug Allergies:  No Known Allergies  Physical Exam: BP 130/80 (BP Location: Left Arm, Patient Position: Sitting, Cuff Size: Normal)   Pulse 80   Temp 97.8 F (36.6 C) (Temporal)   Resp 18   Ht 5' 10.08" (1.78 m)   Wt 202 lb 6.4 oz (91.8 kg)   SpO2 98%   BMI 28.98 kg/m    Physical Exam Vitals reviewed.  Constitutional:      Appearance: Normal appearance.  HENT:     Head: Normocephalic and atraumatic.     Right Ear: Tympanic membrane normal.     Left Ear: Tympanic membrane normal.     Nose:     Comments: Bilateral nares slightly erythematous with thin clear nasal drainage noted.  Pharynx slightly erythematous with no exudate.  Ears normal.  Eyes normal.    Mouth/Throat:     Pharynx: Oropharynx is clear.  Eyes:     Conjunctiva/sclera: Conjunctivae normal.  Cardiovascular:     Rate and Rhythm: Normal rate and regular rhythm.     Heart sounds: Normal heart sounds. No murmur heard. Pulmonary:     Effort: Pulmonary effort is normal.     Breath sounds: Normal breath sounds.     Comments: Lungs clear to auscultation Musculoskeletal:        General: Normal range of motion.     Cervical back: Normal range of motion and neck supple.  Skin:    General: Skin is warm and dry.  Neurological:  Mental Status: He is alert and oriented to person, place, and time.  Psychiatric:        Mood and Affect: Mood normal.        Behavior: Behavior normal.        Thought Content: Thought content normal.        Judgment: Judgment normal.     Diagnostics: FVC 4.38 which is 85% of predicted value, FEV1 3.68 which is 90% of predicted value.  Spirometry indicates normal ventilatory function.  Assessment and Plan: 1. Moderate persistent asthma without complication   2. Seasonal and perennial  allergic rhinitis   3. Seasonal allergic conjunctivitis   4. Gastroesophageal reflux disease, unspecified whether esophagitis present     Meds ordered this encounter  Medications   DISCONTD: fluticasone  (FLOVENT  HFA) 110 MCG/ACT inhaler    Sig: Inhale 2 puffs into the lungs in the morning and at bedtime. Use for 1-2 weeks for an asthma flare up    Dispense:  12 g    Refill:  2    Please dispense insurance covered either brand or generic   DISCONTD: fluticasone  (FLOVENT  HFA) 110 MCG/ACT inhaler    Sig: For asthma flare, begin Arnuity 1 puff once a day for 1 to 2 weeks or until cough and wheeze free, then stop    Dispense:  1 each    Refill:  5   Fluticasone  Furoate (ARNUITY ELLIPTA ) 100 MCG/ACT AEPB    Sig: For asthma flare, begin Arnuity 1 puff once a day for 1 to 2 weeks or until cough and wheeze free    Dispense:  1 each    Refill:  3    Patient Instructions  Asthma Continue Breztri  2 puffs twice a day with a spacer to prevent cough or wheeze Continue albuterol  2 puffs once every 4 hours if needed for cough or wheeze You may use albuterol  2 puffs 5 to 15 minutes before activity to decrease cough or wheeze For asthma flare, begin Flovent  110-2 puffs twice a day for 1-2 weeks or until cough and wheeze free, then stop Consider Tezspire injections to control asthma. Written information provided  Allergic rhinitis Continue allergen avoidance measures directed toward grass pollen, tree pollen, weed pollen, mold, dust mite, cat, dog, horse, and cockroach Continue allergen immunotherapy and have access to an epinephrine  autoinjector per injection protocol Continue cetirizine  10 mg once a day if needed for runny nose or Continue Flonase  2 sprays in each nostril up to twice a day if needed for a stuffy nose.  In the right nostril, point the applicator out toward the right ear. In the left nostril, point the applicator out toward the left ear Continue azelastine  1 to 2 sprays in each  nostril up to twice a day if needed for runny nose or itchy nose Consider saline nasal rinses as needed for nasal symptoms. Use this before any medicated nasal sprays for best result  Allergic conjunctivitis Some over the counter eye drops include Pataday  one drop in each eye once a day as needed for red, itchy eyes OR Zaditor one drop in each eye twice a day as needed for red itchy eyes. Avoid eye drops that say red eye relief as they may contain medications that dry out your eyes.   Reflux Continue dietary and lifestyle modifications as lsited below  Call the clinic if this treatment plan is not working well for you.  Follow up in 6 months or sooner if needed.   Return in about  6 months (around 12/18/2023), or if symptoms worsen or fail to improve.    Thank you for the opportunity to care for this patient.  Please do not hesitate to contact me with questions.  Marinus Sic, FNP [facility] 

## 2023-06-16 NOTE — Patient Instructions (Addendum)
 Asthma Continue Breztri  2 puffs twice a day with a spacer to prevent cough or wheeze Continue albuterol  2 puffs once every 4 hours if needed for cough or wheeze You may use albuterol  2 puffs 5 to 15 minutes before activity to decrease cough or wheeze For asthma flare, begin Flovent  110-2 puffs twice a day for 1-2 weeks or until cough and wheeze free, then stop Consider Tezspire injections to control asthma. Written information provided  Allergic rhinitis Continue allergen avoidance measures directed toward grass pollen, tree pollen, weed pollen, mold, dust mite, cat, dog, horse, and cockroach Continue allergen immunotherapy and have access to an epinephrine  autoinjector per injection protocol Continue cetirizine  10 mg once a day if needed for runny nose or Continue Flonase  2 sprays in each nostril up to twice a day if needed for a stuffy nose.  In the right nostril, point the applicator out toward the right ear. In the left nostril, point the applicator out toward the left ear Continue azelastine  1 to 2 sprays in each nostril up to twice a day if needed for runny nose or itchy nose Consider saline nasal rinses as needed for nasal symptoms. Use this before any medicated nasal sprays for best result  Allergic conjunctivitis Some over the counter eye drops include Pataday  one drop in each eye once a day as needed for red, itchy eyes OR Zaditor one drop in each eye twice a day as needed for red itchy eyes. Avoid eye drops that say red eye relief as they may contain medications that dry out your eyes.   Reflux Continue dietary and lifestyle modifications as lsited below  Call the clinic if this treatment plan is not working well for you.  Follow up in 6 months or sooner if needed.  Reducing Pollen Exposure The American Academy of [employer] , [employer] suggests the following steps to reduce your exposure to pollen during allergy  seasons. Do not hang sheets or clothing out to dry;  pollen may collect on these items. Do not mow lawns or spend time around freshly cut grass; mowing stirs up pollen. Keep windows closed at night.  Keep car windows closed while driving. Minimize morning activities outdoors, a time when pollen counts are usually at their highest. Stay indoors as much as possible when pollen counts or humidity is high and on windy days when pollen tends to remain in the air longer. Use air conditioning when possible.  Many air conditioners have filters that trap the pollen spores. Use a HEPA room air filter to remove pollen form the indoor air you breathe.  Control of Mold Allergen Mold and fungi can grow on a variety of surfaces provided certain temperature and moisture conditions exist.  Outdoor molds grow on plants, decaying vegetation and soil.  The major outdoor mold, Alternaria and Cladosporium, are found in very high numbers during hot and dry conditions.  Generally, a late Summer - Fall peak is seen for common outdoor fungal spores.  Rain will temporarily lower outdoor mold spore count, but counts rise rapidly when the rainy period ends.  The most important indoor molds are Aspergillus and Penicillium.  Dark, humid and poorly ventilated basements are ideal sites for mold growth.  The next most common sites of mold growth are the bathroom and the kitchen.  Outdoor [employer] Use air conditioning and keep windows closed Avoid exposure to decaying vegetation. Avoid leaf raking. Avoid grain handling. Consider wearing a face mask if working in moldy areas.  Indoor Mold  Control Maintain humidity below 50%. Clean washable surfaces with 5% bleach solution. Remove sources e.g. Contaminated carpets.   Control of Dust Mite Allergen Dust mites play a major role in allergic asthma and rhinitis. They occur in environments with high humidity wherever human skin is found. Dust mites absorb humidity from the atmosphere (ie, they do not drink) and feed on organic  matter (including shed human and animal skin). Dust mites are a microscopic type of insect that you cannot see with the naked eye. High levels of dust mites have been detected from mattresses, pillows, carpets, upholstered furniture, bed covers, clothes, soft toys and any woven material. The principal allergen of the dust mite is found in its feces. A gram of dust may contain 1,000 mites and 250,000 fecal particles. Mite antigen is easily measured in the air during house cleaning activities. Dust mites do not bite and do not cause harm to humans, other than by triggering allergies/asthma.  Ways to decrease your exposure to dust mites in your home:  1. Encase mattresses, box springs and pillows with a mite-impermeable barrier or cover  2. Wash sheets, blankets and drapes weekly in hot water (130 F) with detergent and dry them in a dryer on the hot setting.  3. Have the room cleaned frequently with a vacuum cleaner and a damp dust-mop. For carpeting or rugs, vacuuming with a vacuum cleaner equipped with a high-efficiency particulate air (HEPA) filter. The dust mite allergic individual should not be in a room which is being cleaned and should wait 1 hour after cleaning before going into the room.  4. Do not sleep on upholstered furniture (eg, couches).  5. If possible removing carpeting, upholstered furniture and drapery from the home is ideal. Horizontal blinds should be eliminated in the rooms where the person spends the most time (bedroom, study, television room). Washable vinyl, roller-type shades are optimal.  6. Remove all non-washable stuffed toys from the bedroom. Wash stuffed toys weekly like sheets and blankets above.  7. Reduce indoor humidity to less than 50%. Inexpensive humidity monitors can be purchased at most hardware stores. Do not use a humidifier as can make the problem worse and are not recommended.  Control of Dog or Cat Allergen Avoidance is the best way to manage a dog or cat  allergy . If you have a dog or cat and are allergic to dog or cats, consider removing the dog or cat from the home. If you have a dog or cat but don't want to find it a new home, or if your family wants a pet even though someone in the household is allergic, here are some strategies that may help keep symptoms at bay:  Keep the pet out of your bedroom and restrict it to only a few rooms. Be advised that keeping the dog or cat in only one room will not limit the allergens to that room. Don't pet, hug or kiss the dog or cat; if you do, wash your hands with soap and water. High-efficiency particulate air (HEPA) cleaners run continuously in a bedroom or living room can reduce allergen levels over time. Regular use of a high-efficiency vacuum cleaner or a central vacuum can reduce allergen levels. Giving your dog or cat a bath at least once a week can reduce airborne allergen.  Control of Cockroach Allergen Cockroach allergen has been identified as an important cause of acute attacks of asthma, especially in urban settings.  There are fifty-five species of cockroach that exist in the  [place] , however only three, the [place], [place] and [place] species produce allergen that can affect patients with Asthma.  Allergens can be obtained from fecal particles, egg casings and secretions from cockroaches.    Remove food sources. Reduce access to water. Seal access and entry points. Spray runways with 0.5-1% Diazinon or Chlorpyrifos Blow boric acid power under stoves and refrigerator. Place bait stations (hydramethylnon) at feeding sites.

## 2023-06-17 ENCOUNTER — Encounter: Payer: Self-pay | Admitting: Family Medicine

## 2023-06-17 ENCOUNTER — Telehealth: Payer: Self-pay

## 2023-06-17 ENCOUNTER — Ambulatory Visit (INDEPENDENT_AMBULATORY_CARE_PROVIDER_SITE_OTHER): Payer: Commercial Managed Care - PPO | Admitting: Family Medicine

## 2023-06-17 ENCOUNTER — Other Ambulatory Visit: Payer: Self-pay

## 2023-06-17 VITALS — BP 130/80 | HR 80 | Temp 97.8°F | Resp 18 | Ht 70.08 in | Wt 202.4 lb

## 2023-06-17 DIAGNOSIS — J3089 Other allergic rhinitis: Secondary | ICD-10-CM

## 2023-06-17 DIAGNOSIS — K219 Gastro-esophageal reflux disease without esophagitis: Secondary | ICD-10-CM

## 2023-06-17 DIAGNOSIS — H1013 Acute atopic conjunctivitis, bilateral: Secondary | ICD-10-CM

## 2023-06-17 DIAGNOSIS — J4541 Moderate persistent asthma with (acute) exacerbation: Secondary | ICD-10-CM | POA: Diagnosis not present

## 2023-06-17 DIAGNOSIS — J4540 Moderate persistent asthma, uncomplicated: Secondary | ICD-10-CM

## 2023-06-17 DIAGNOSIS — H1010 Acute atopic conjunctivitis, unspecified eye: Secondary | ICD-10-CM

## 2023-06-17 DIAGNOSIS — J309 Allergic rhinitis, unspecified: Secondary | ICD-10-CM

## 2023-06-17 DIAGNOSIS — J302 Other seasonal allergic rhinitis: Secondary | ICD-10-CM

## 2023-06-17 MED ORDER — FLUTICASONE PROPIONATE HFA 110 MCG/ACT IN AERO: INHALATION_SPRAY | RESPIRATORY_TRACT | 5 refills | Status: DC

## 2023-06-17 MED ORDER — ARNUITY ELLIPTA 100 MCG/ACT IN AEPB: INHALATION_SPRAY | RESPIRATORY_TRACT | 3 refills | Status: DC

## 2023-06-17 MED ORDER — FLUTICASONE PROPIONATE HFA 110 MCG/ACT IN AERO: 2.0000 | INHALATION_SPRAY | Freq: Two times a day (BID) | RESPIRATORY_TRACT | 2 refills | Status: DC

## 2023-06-17 NOTE — Telephone Encounter (Signed)
 I called the patient per Rexene Catching and informed of change from flovent  to arnuity. Informed of instructions and informed rx has been sent into the pharmacy.

## 2023-06-18 ENCOUNTER — Encounter: Payer: Self-pay | Admitting: Family Medicine

## 2023-06-18 DIAGNOSIS — J45909 Unspecified asthma, uncomplicated: Secondary | ICD-10-CM | POA: Insufficient documentation

## 2023-06-18 DIAGNOSIS — J302 Other seasonal allergic rhinitis: Secondary | ICD-10-CM | POA: Insufficient documentation

## 2023-06-18 DIAGNOSIS — H1010 Acute atopic conjunctivitis, unspecified eye: Secondary | ICD-10-CM | POA: Insufficient documentation

## 2023-06-18 DIAGNOSIS — K219 Gastro-esophageal reflux disease without esophagitis: Secondary | ICD-10-CM | POA: Insufficient documentation

## 2023-06-19 MED ORDER — NEBULIZER MASK ADULT MISC: 1.0000 | 1 refills | Status: AC

## 2023-06-19 MED ORDER — ALBUTEROL SULFATE (2.5 MG/3ML) 0.083% IN NEBU: 2.5000 mg | INHALATION_SOLUTION | RESPIRATORY_TRACT | 1 refills | Status: DC | PRN

## 2023-06-19 NOTE — Addendum Note (Signed)
 Addended by: Anthon Baston A on: 06/19/2023 02:34 PM   Modules accepted: Orders

## 2023-06-26 ENCOUNTER — Ambulatory Visit (INDEPENDENT_AMBULATORY_CARE_PROVIDER_SITE_OTHER)

## 2023-06-26 DIAGNOSIS — J309 Allergic rhinitis, unspecified: Secondary | ICD-10-CM | POA: Diagnosis not present

## 2023-07-12 ENCOUNTER — Ambulatory Visit (INDEPENDENT_AMBULATORY_CARE_PROVIDER_SITE_OTHER)

## 2023-07-12 DIAGNOSIS — J309 Allergic rhinitis, unspecified: Secondary | ICD-10-CM | POA: Diagnosis not present

## 2023-08-07 ENCOUNTER — Ambulatory Visit (INDEPENDENT_AMBULATORY_CARE_PROVIDER_SITE_OTHER)

## 2023-08-07 DIAGNOSIS — J309 Allergic rhinitis, unspecified: Secondary | ICD-10-CM

## 2023-08-14 ENCOUNTER — Ambulatory Visit (INDEPENDENT_AMBULATORY_CARE_PROVIDER_SITE_OTHER)

## 2023-08-14 DIAGNOSIS — J309 Allergic rhinitis, unspecified: Secondary | ICD-10-CM

## 2023-08-15 DIAGNOSIS — J3081 Allergic rhinitis due to animal (cat) (dog) hair and dander: Secondary | ICD-10-CM | POA: Diagnosis not present

## 2023-08-15 NOTE — Progress Notes (Signed)
 VIALS MADE 08-15-23

## 2023-08-16 DIAGNOSIS — J302 Other seasonal allergic rhinitis: Secondary | ICD-10-CM | POA: Diagnosis not present

## 2023-08-30 ENCOUNTER — Ambulatory Visit (INDEPENDENT_AMBULATORY_CARE_PROVIDER_SITE_OTHER): Payer: Self-pay

## 2023-08-30 DIAGNOSIS — J309 Allergic rhinitis, unspecified: Secondary | ICD-10-CM | POA: Diagnosis not present

## 2023-09-20 ENCOUNTER — Ambulatory Visit (INDEPENDENT_AMBULATORY_CARE_PROVIDER_SITE_OTHER)

## 2023-09-20 DIAGNOSIS — J309 Allergic rhinitis, unspecified: Secondary | ICD-10-CM

## 2023-10-02 ENCOUNTER — Ambulatory Visit (INDEPENDENT_AMBULATORY_CARE_PROVIDER_SITE_OTHER)

## 2023-10-02 DIAGNOSIS — J309 Allergic rhinitis, unspecified: Secondary | ICD-10-CM | POA: Diagnosis not present

## 2023-10-23 ENCOUNTER — Ambulatory Visit (INDEPENDENT_AMBULATORY_CARE_PROVIDER_SITE_OTHER)

## 2023-10-23 DIAGNOSIS — J309 Allergic rhinitis, unspecified: Secondary | ICD-10-CM | POA: Diagnosis not present

## 2023-11-01 ENCOUNTER — Ambulatory Visit (INDEPENDENT_AMBULATORY_CARE_PROVIDER_SITE_OTHER)

## 2023-11-01 DIAGNOSIS — J309 Allergic rhinitis, unspecified: Secondary | ICD-10-CM

## 2023-11-01 MED ORDER — BREZTRI AEROSPHERE 160-9-4.8 MCG/ACT IN AERO: 2.0000 | INHALATION_SPRAY | Freq: Two times a day (BID) | RESPIRATORY_TRACT | 0 refills | Status: DC

## 2023-11-22 ENCOUNTER — Ambulatory Visit (INDEPENDENT_AMBULATORY_CARE_PROVIDER_SITE_OTHER): Payer: Self-pay

## 2023-11-22 DIAGNOSIS — J309 Allergic rhinitis, unspecified: Secondary | ICD-10-CM

## 2023-12-04 ENCOUNTER — Ambulatory Visit

## 2023-12-04 DIAGNOSIS — J309 Allergic rhinitis, unspecified: Secondary | ICD-10-CM

## 2023-12-13 ENCOUNTER — Ambulatory Visit (INDEPENDENT_AMBULATORY_CARE_PROVIDER_SITE_OTHER)

## 2023-12-13 DIAGNOSIS — J309 Allergic rhinitis, unspecified: Secondary | ICD-10-CM | POA: Diagnosis not present

## 2023-12-18 ENCOUNTER — Ambulatory Visit (INDEPENDENT_AMBULATORY_CARE_PROVIDER_SITE_OTHER)

## 2023-12-18 DIAGNOSIS — J309 Allergic rhinitis, unspecified: Secondary | ICD-10-CM

## 2023-12-25 ENCOUNTER — Ambulatory Visit (INDEPENDENT_AMBULATORY_CARE_PROVIDER_SITE_OTHER)

## 2023-12-25 DIAGNOSIS — J309 Allergic rhinitis, unspecified: Secondary | ICD-10-CM | POA: Diagnosis not present

## 2023-12-30 ENCOUNTER — Encounter: Payer: Self-pay | Admitting: Internal Medicine

## 2023-12-30 ENCOUNTER — Ambulatory Visit

## 2023-12-30 ENCOUNTER — Ambulatory Visit: Admitting: Internal Medicine

## 2023-12-30 VITALS — BP 130/82 | HR 73 | Temp 98.7°F | Ht 71.0 in | Wt 205.8 lb

## 2023-12-30 DIAGNOSIS — J3089 Other allergic rhinitis: Secondary | ICD-10-CM

## 2023-12-30 DIAGNOSIS — J302 Other seasonal allergic rhinitis: Secondary | ICD-10-CM

## 2023-12-30 DIAGNOSIS — J309 Allergic rhinitis, unspecified: Secondary | ICD-10-CM

## 2023-12-30 DIAGNOSIS — K219 Gastro-esophageal reflux disease without esophagitis: Secondary | ICD-10-CM | POA: Diagnosis not present

## 2023-12-30 DIAGNOSIS — J4540 Moderate persistent asthma, uncomplicated: Secondary | ICD-10-CM

## 2023-12-30 MED ORDER — FLUTICASONE PROPIONATE 50 MCG/ACT NA SUSP: 2.0000 | Freq: Every day | NASAL | 1 refills | Status: AC

## 2023-12-30 MED ORDER — BREZTRI AEROSPHERE 160-9-4.8 MCG/ACT IN AERO: 2.0000 | INHALATION_SPRAY | Freq: Two times a day (BID) | RESPIRATORY_TRACT | 1 refills | Status: AC

## 2023-12-30 MED ORDER — CETIRIZINE HCL 10 MG PO TABS: 10.0000 mg | ORAL_TABLET | Freq: Every day | ORAL | 1 refills | Status: AC | PRN

## 2023-12-30 MED ORDER — ALBUTEROL SULFATE HFA 108 (90 BASE) MCG/ACT IN AERS: 2.0000 | INHALATION_SPRAY | Freq: Four times a day (QID) | RESPIRATORY_TRACT | 1 refills | Status: AC | PRN

## 2023-12-30 MED ORDER — AZELASTINE HCL 0.1 % NA SOLN: 2.0000 | Freq: Two times a day (BID) | NASAL | 1 refills | Status: AC | PRN

## 2023-12-30 MED ORDER — EPINEPHRINE 0.3 MG/0.3ML IJ SOAJ: 0.3000 mg | INTRAMUSCULAR | 1 refills | Status: AC | PRN

## 2023-12-30 MED ORDER — ALBUTEROL SULFATE (2.5 MG/3ML) 0.083% IN NEBU: 2.5000 mg | INHALATION_SOLUTION | RESPIRATORY_TRACT | 1 refills | Status: AC | PRN

## 2023-12-30 NOTE — Progress Notes (Signed)
 FOLLOW UP Date of Service/Encounter:  12/30/23   Subjective:  Ronald Gibson (DOB: 20-Feb-1975) is a 48 y.o. male who [facility] for follow up for asthma, allergic rhinitis, reflux.   History obtained from: chart review and patient. Last seen by Arlean Mutter and at the time discussed possibly Tezspire initiation if asthma becomes uncontrolled/flares up.  Continue AIT with Flonase , Azelastine , Zyrtec .   Asthma is doing well.  Rarely needs Albuterol .  Taking Breztri  BID and reports breathing is controlled. No ER/urgent care/oral prednisone  use since last visit.  Allergies are doing better with shots also. Some AM congestion and drainage but resolves as the day goes by. No issues with AIT. Has an Epipen .  Using Flonase  and Zyrtec  daily. Has not needed Azelastine  much. Sometimes has trouble with heartburn usually depending on what he ate or how late.  Has Pepcid  to use PRN.  Past Medical History: Past Medical History:  Diagnosis Date   Asthma     Objective:  BP 130/82 (BP Location: Left Arm, Patient Position: Sitting, Cuff Size: Large)   Pulse 73   Temp 98.7 F (37.1 C) (Temporal)   Ht 5' 11 (1.803 m)   Wt 205 lb 12.8 oz (93.4 kg)   SpO2 98%   BMI 28.70 kg/m  Body mass index is 28.7 kg/m. Physical Exam: GEN: alert, well developed HEENT: clear conjunctiva, nose with mild inferior turbinate hypertrophy, pink nasal mucosa, no rhinorrhea, no cobblestoning HEART: regular rate and rhythm, no murmur LUNGS: clear to auscultation bilaterally, no coughing, unlabored respiration SKIN: no rashes or lesions  Spirometry:  Tracings reviewed. His effort: Good reproducible efforts. FVC: 4.47L, 87% predicted  FEV1: 3.91L, 96% predicted FEV1/FVC ratio: 87% Interpretation: Spirometry consistent with normal pattern.  Please see scanned spirometry results for details.  Assessment:   1. Seasonal and perennial allergic rhinitis   2. Gastroesophageal  reflux disease, unspecified whether esophagitis present   3. Moderate persistent asthma without complication     Plan/Recommendations:   Allergic Rhinitis: - Controlled, continue AIT. Will consider stopping Flonase  at next visit.  - Positive skin test 03/2022: trees, grasses, weeds, mold, DM, cat, dog, horse, cockroach.  - Use nasal saline rinses before nose sprays such as with Neilmed Sinus Rinse.  Use distilled water.   - Use Flonase  2 sprays each nostril daily. Aim upward and outward. - Use Azelastine  2 sprays each nostril twice daily as needed for congestion, drainage, runny nose. Aim upward and outward. - Use Zyrtec  10 mg daily.  - For eyes, use Olopatadine  or Ketotifen 1 eye drop daily as needed for itchy, watery eyes.  Available over the counter, if not covered by insurance.  - Continue allergy  shots on schedule. Keep Epipen . Initiated 04/2021. Reached red vial 11/2022.    Moderate Persistent Asthma: - Well controlled, normal spirometry today. Can consider de-escalation in Spring/Summer.  - Maintenance inhaler:  Continue Breztri  160-9-4.16mcg 2 puffs twice daily with spacer. Brush and gargle after.   - Rescue inhaler: Albuterol  2 puffs via spacer or 1 vial via nebulizer every 4-6 hours as needed for respiratory symptoms of cough, shortness of breath, or wheezing Asthma control goals:  Full participation in all desired activities (may need albuterol  before activity) Albuterol  use two times or less a week on average (not counting use with activity) Cough interfering with sleep two times or less a month Oral steroids no more than once a year No hospitalizations  GERD - Controlled  -Use  Pepcid  20mg  daily as needed. -Avoid lying down for at least two hours after a meal or after drinking acidic beverages, like soda, or other caffeinated beverages. This can help to prevent stomach contents from flowing back into the esophagus. -Keep your head elevated while you sleep. Using an extra  pillow or two can also help to prevent reflux. -Eat smaller and more frequent meals each day instead of a few large meals. This promotes digestion and can aid in preventing heartburn. -Wear loose-fitting clothes to ease pressure on the stomach, which can worsen heartburn and reflux. -Reduce excess weight around the midsection. This can ease pressure on the stomach. Such pressure can force some stomach contents back up the esophagus.     Return in about 6 months (around 06/29/2024).  Arleta Blanch, MD [facility] 

## 2023-12-30 NOTE — Patient Instructions (Addendum)
 Allergic Rhinitis: - Positive skin test 03/2022: trees, grasses, weeds, mold, DM, cat, dog, horse, cockroach.  - Use nasal saline rinses before nose sprays such as with Neilmed Sinus Rinse.  Use distilled water.   - Use Flonase  2 sprays each nostril daily. Aim upward and outward. - Use Azelastine  2 sprays each nostril twice daily as needed for congestion, drainage, runny nose. Aim upward and outward. - Use Zyrtec  10 mg daily.  - For eyes, use Olopatadine  or Ketotifen 1 eye drop daily as needed for itchy, watery eyes.  Available over the counter, if not covered by insurance.  - Continue allergy  shots on schedule. Keep Epipen . Initiated 04/2021. Reached red vial 11/2022.    Moderate Persistent Asthma: - Maintenance inhaler:  Continue Breztri  160-9-4.61mcg 2 puffs twice daily with spacer. Brush and gargle after.   - Rescue inhaler: Albuterol  2 puffs via spacer or 1 vial via nebulizer every 4-6 hours as needed for respiratory symptoms of cough, shortness of breath, or wheezing Asthma control goals:  Full participation in all desired activities (may need albuterol  before activity) Albuterol  use two times or less a week on average (not counting use with activity) Cough interfering with sleep two times or less a month Oral steroids no more than once a year No hospitalizations  GERD -Use Pepcid  20mg  daily as needed. -Avoid lying down for at least two hours after a meal or after drinking acidic beverages, like soda, or other caffeinated beverages. This can help to prevent stomach contents from flowing back into the esophagus. -Keep your head elevated while you sleep. Using an extra pillow or two can also help to prevent reflux. -Eat smaller and more frequent meals each day instead of a few large meals. This promotes digestion and can aid in preventing heartburn. -Wear loose-fitting clothes to ease pressure on the stomach, which can worsen heartburn and reflux. -Reduce excess weight around the  midsection. This can ease pressure on the stomach. Such pressure can force some stomach contents back up the esophagus.

## 2024-01-17 ENCOUNTER — Ambulatory Visit (INDEPENDENT_AMBULATORY_CARE_PROVIDER_SITE_OTHER)

## 2024-01-17 DIAGNOSIS — J309 Allergic rhinitis, unspecified: Secondary | ICD-10-CM

## 2024-02-12 ENCOUNTER — Ambulatory Visit (INDEPENDENT_AMBULATORY_CARE_PROVIDER_SITE_OTHER)

## 2024-02-12 DIAGNOSIS — J302 Other seasonal allergic rhinitis: Secondary | ICD-10-CM | POA: Diagnosis not present

## 2024-02-28 ENCOUNTER — Ambulatory Visit

## 2024-02-28 DIAGNOSIS — J302 Other seasonal allergic rhinitis: Secondary | ICD-10-CM | POA: Diagnosis not present

## 2024-03-06 ENCOUNTER — Ambulatory Visit

## 2024-03-06 DIAGNOSIS — J302 Other seasonal allergic rhinitis: Secondary | ICD-10-CM

## 2024-07-01 ENCOUNTER — Ambulatory Visit: Admitting: Allergy & Immunology
